# Patient Record
Sex: Female | Born: 1950
Health system: Southern US, Community
[De-identification: ages and names within clinical notes are randomized; demographics above are authoritative.]

## PROBLEM LIST (undated history)

## (undated) DIAGNOSIS — D693 Immune thrombocytopenic purpura: Secondary | ICD-10-CM

## (undated) DIAGNOSIS — K219 Gastro-esophageal reflux disease without esophagitis: Secondary | ICD-10-CM

## (undated) DIAGNOSIS — E785 Hyperlipidemia, unspecified: Secondary | ICD-10-CM

## (undated) DIAGNOSIS — T7840XA Allergy, unspecified, initial encounter: Secondary | ICD-10-CM

## (undated) DIAGNOSIS — D249 Benign neoplasm of unspecified breast: Secondary | ICD-10-CM

## (undated) HISTORY — DX: Immune thrombocytopenic purpura: D69.3

## (undated) HISTORY — DX: Hyperlipidemia, unspecified: E78.5

## (undated) HISTORY — DX: Allergy, unspecified, initial encounter: T78.40XA

## (undated) HISTORY — DX: Gastro-esophageal reflux disease without esophagitis: K21.9

## (undated) HISTORY — DX: Benign neoplasm of unspecified breast: D24.9

---

## 1994-11-17 DIAGNOSIS — D249 Benign neoplasm of unspecified breast: Secondary | ICD-10-CM

## 1994-11-17 HISTORY — DX: Benign neoplasm of unspecified breast: D24.9

## 1995-11-18 DIAGNOSIS — D693 Immune thrombocytopenic purpura: Secondary | ICD-10-CM

## 1995-11-18 HISTORY — DX: Immune thrombocytopenic purpura: D69.3

## 1997-11-17 LAB — HM DEXA SCAN: HM Dexa Scan: NORMAL

## 1998-11-02 ENCOUNTER — Other Ambulatory Visit: Admission: RE | Admit: 1998-11-02 | Discharge: 1998-11-02 | Payer: Self-pay | Admitting: Obstetrics and Gynecology

## 1999-11-20 ENCOUNTER — Other Ambulatory Visit: Admission: RE | Admit: 1999-11-20 | Discharge: 1999-11-20 | Payer: Self-pay | Admitting: Obstetrics and Gynecology

## 2000-12-17 ENCOUNTER — Other Ambulatory Visit: Admission: RE | Admit: 2000-12-17 | Discharge: 2000-12-17 | Payer: Self-pay | Admitting: Obstetrics and Gynecology

## 2001-04-17 ENCOUNTER — Encounter: Payer: Self-pay | Admitting: Internal Medicine

## 2001-04-17 LAB — CONVERTED CEMR LAB

## 2001-11-18 LAB — HM COLONOSCOPY

## 2003-07-14 ENCOUNTER — Other Ambulatory Visit: Admission: RE | Admit: 2003-07-14 | Discharge: 2003-07-14 | Payer: Self-pay | Admitting: Obstetrics and Gynecology

## 2004-08-30 ENCOUNTER — Other Ambulatory Visit: Admission: RE | Admit: 2004-08-30 | Discharge: 2004-08-30 | Payer: Self-pay | Admitting: Obstetrics and Gynecology

## 2006-01-01 ENCOUNTER — Ambulatory Visit: Payer: Self-pay | Admitting: Internal Medicine

## 2006-05-15 ENCOUNTER — Other Ambulatory Visit: Admission: RE | Admit: 2006-05-15 | Discharge: 2006-05-15 | Payer: Self-pay | Admitting: Obstetrics and Gynecology

## 2007-06-08 ENCOUNTER — Encounter: Payer: Self-pay | Admitting: Internal Medicine

## 2007-06-08 DIAGNOSIS — J301 Allergic rhinitis due to pollen: Secondary | ICD-10-CM | POA: Insufficient documentation

## 2007-06-08 DIAGNOSIS — K219 Gastro-esophageal reflux disease without esophagitis: Secondary | ICD-10-CM | POA: Insufficient documentation

## 2007-06-08 DIAGNOSIS — D693 Immune thrombocytopenic purpura: Secondary | ICD-10-CM | POA: Insufficient documentation

## 2007-06-08 DIAGNOSIS — E785 Hyperlipidemia, unspecified: Secondary | ICD-10-CM | POA: Insufficient documentation

## 2007-06-14 ENCOUNTER — Ambulatory Visit: Payer: Self-pay | Admitting: Internal Medicine

## 2007-06-14 DIAGNOSIS — E749 Disorder of carbohydrate metabolism, unspecified: Secondary | ICD-10-CM | POA: Insufficient documentation

## 2007-06-15 LAB — CONVERTED CEMR LAB
Basophils Absolute: 0 10*3/uL (ref 0.0–0.1)
Basophils Relative: 1 % (ref 0–1)
Eosinophils Absolute: 0.1 10*3/uL (ref 0.0–0.7)
Eosinophils Relative: 1 % (ref 0–5)
Glucose, Bld: 75 mg/dL (ref 70–99)
HCT: 41.8 % (ref 36.0–46.0)
Hemoglobin: 13.7 g/dL (ref 12.0–15.0)
Lymphocytes Relative: 26 % (ref 12–46)
Lymphs Abs: 1 10*3/uL (ref 0.7–3.3)
MCHC: 32.8 g/dL (ref 30.0–36.0)
MCV: 90.9 fL (ref 78.0–100.0)
Monocytes Absolute: 0.2 10*3/uL (ref 0.2–0.7)
Monocytes Relative: 6 % (ref 3–11)
Neutro Abs: 2.7 10*3/uL (ref 1.7–7.7)
Neutrophils Relative %: 66 % (ref 43–77)
Platelets: 132 10*3/uL — ABNORMAL LOW (ref 150–400)
RBC: 4.6 M/uL (ref 3.87–5.11)
RDW: 13.4 % (ref 11.5–14.0)
WBC: 4 10*3/uL (ref 4.0–10.5)

## 2007-11-16 ENCOUNTER — Encounter: Payer: Self-pay | Admitting: Internal Medicine

## 2007-11-24 ENCOUNTER — Encounter (INDEPENDENT_AMBULATORY_CARE_PROVIDER_SITE_OTHER): Payer: Self-pay | Admitting: *Deleted

## 2008-02-08 ENCOUNTER — Other Ambulatory Visit: Admission: RE | Admit: 2008-02-08 | Discharge: 2008-02-08 | Payer: Self-pay | Admitting: Obstetrics and Gynecology

## 2009-07-12 ENCOUNTER — Ambulatory Visit: Payer: Self-pay | Admitting: General Practice

## 2010-05-27 ENCOUNTER — Ambulatory Visit: Payer: Self-pay | Admitting: Obstetrics and Gynecology

## 2010-05-27 ENCOUNTER — Other Ambulatory Visit: Admission: RE | Admit: 2010-05-27 | Discharge: 2010-05-27 | Payer: Self-pay | Admitting: Obstetrics and Gynecology

## 2010-06-17 HISTORY — PX: WRIST FRACTURE SURGERY: SHX121

## 2010-07-04 ENCOUNTER — Ambulatory Visit: Payer: Self-pay | Admitting: Obstetrics and Gynecology

## 2010-07-13 ENCOUNTER — Emergency Department (HOSPITAL_COMMUNITY): Admission: EM | Admit: 2010-07-13 | Discharge: 2010-07-13 | Payer: Self-pay | Admitting: Family Medicine

## 2010-10-18 ENCOUNTER — Ambulatory Visit: Payer: Self-pay | Admitting: Internal Medicine

## 2010-10-21 LAB — CONVERTED CEMR LAB: Glucose, Bld: 98 mg/dL (ref 70–99)

## 2010-10-28 ENCOUNTER — Encounter: Payer: Self-pay | Admitting: Internal Medicine

## 2010-10-28 LAB — HM MAMMOGRAPHY

## 2010-10-29 ENCOUNTER — Encounter: Payer: Self-pay | Admitting: Internal Medicine

## 2010-12-19 NOTE — Letter (Signed)
Summary: Results Follow up Letter  [place] at [facility]  [address] [place], [place] [address]   Phone: [phone]  Fax: [phone]    11/24/2007 MRN: [id number]  [facility] [address] [place], [place]  [address]  Dear Ms. Grinage,  The following are the results of your recent test(s):  Test         Result    Pap Smear:        Normal _____  Not Normal _____ Comments: ______________________________________________________ Cholesterol: LDL(Bad cholesterol):         Your goal is less than:         HDL (Good cholesterol):       Your goal is more than: Comments:  ______________________________________________________ Mammogram:        Normal _X____  Not Normal _____ Comments:  ___________________________________________________________________ Hemoccult:        Normal _____  Not normal _______ Comments:    _____________________________________________________________________ Other Tests:    We routinely do not discuss normal results over the telephone.  If you desire a copy of the results, or you have any questions about this information we can discuss them at your next office visit.   Sincerely,  Dr. Alphonsus Sias

## 2010-12-19 NOTE — Miscellaneous (Signed)
  Clinical Lists Changes  Observations: Added new observation of MAMMOGRAM: normal (11/24/2007 7:54)       Preventive Care Screening  Mammogram:    Date:  11/24/2007    Results:  normal

## 2010-12-19 NOTE — Assessment & Plan Note (Signed)
Summary: CPX/CLE   Vital Signs:  Patient profile:   60 year old female Height:      66 inches Weight:      118.75 pounds BMI:     19.24 Temp:     97.7 degrees F oral Pulse rate:   64 / minute Pulse rhythm:   regular BP sitting:   120 / 90  (left arm) Cuff size:   regular  Vitals Entered By: Linde Gillis CMA Duncan Dull) (October 18, 2010 8:10 AM) CC: complete physicial, no pap   History of Present Illness: DOing well Has only had minor illnesses--generally goes to clinic through work  Still sees Dr Dianne Dun Keeps up with PAPs Due for mammogram--generally gets it through Dr Yolanda Bonine Due for colonoscopy in 2 years  Fractured left wrist in summer and needed surgery seems to have healed okay  Allergies (verified): No Known Drug Allergies  Past History:  Past medical, surgical, family and social histories (including risk factors) reviewed for relevance to current acute and chronic problems.  Past Medical History: Reviewed history from 06/08/2007 and no changes required. Allergic rhinitis GERD Hyperlipidemia ITP--1997  Past Surgical History: Bx  1968 Benign Right breast tumor  ~1996 DEXA- normal 1999 Right wrist fracture repaired 8/11  Family History: Reviewed history from 06/08/2007 and no changes required. Dad died @71    lung cancer, HTN Mom died @57   SLE, myocarditis Dennie Bible GF died of MI, had DM No breast cancer  Social History: Reviewed history from 06/08/2007 and no changes required. Marital Status: Married Children: None Occupation: Child psychotherapist- [employer] Never Smoked Alcohol use-rare  Review of Systems General:  weight stable sleeps well wears seat belt. Eyes:  Denies double vision and vision loss-1 eye. ENT:  Complains of decreased hearing; denies ringing in ears; mild hearing loss per husband Teeth okay-- regular with dentist. CV:  Denies chest pain or discomfort, difficulty breathing at night, difficulty breathing while lying  down, fainting, lightheadness, palpitations, and shortness of breath with exertion. Resp:  Denies cough and shortness of breath. GI:  Denies abdominal pain, bloody stools, change in bowel habits, dark tarry stools, indigestion, nausea, and vomiting. GU:  Denies dysuria and incontinence; No sexual problems. MS:  Denies joint pain and joint swelling. Derm:  Denies lesion(s) and rash. Neuro:  Denies headaches, numbness, tingling, and weakness. Psych:  Denies anxiety and depression. Heme:  Denies abnormal bruising and enlarge lymph nodes. Allergy:  Denies seasonal allergies and sneezing.  Physical Exam  General:  alert and normal appearance.   Eyes:  pupils equal, pupils round, pupils reactive to light, and no optic disk abnormalities.   Ears:  R ear normal and L ear normal.   Mouth:  no erythema, no exudates, and no lesions.   Neck:  supple, no masses, no thyromegaly, no carotid bruits, and no cervical lymphadenopathy.   Lungs:  normal respiratory effort, no intercostal retractions, no accessory muscle use, and normal breath sounds.   Heart:  normal rate, regular rhythm, no murmur, and no gallop.   Abdomen:  soft, non-tender, and no masses.   Msk:  no joint tenderness and no joint swelling.   Pulses:  1+ in feet Extremities:  no edema Neurologic:  alert & oriented X3, strength normal in all extremities, and gait normal.   Skin:  no rashes and no suspicious lesions.   Axillary Nodes:  No palpable lymphadenopathy Psych:  normally interactive, good eye contact, not anxious appearing, and not depressed appearing.  Impression & Recommendations:  Problem # 1:  PREVENTIVE HEALTH CARE (ICD-V70.0) Assessment Comment Only  healthy Tdap given will see regularly when done with gyn check glucose  Orders: Venipuncture (78295) TLB-Glucose, QUANT (82947-GLU)  Complete Medication List: 1)  Cod Liver Oil Caps (Cod liver oil) .... Take one tablet by mouth daily 2)  Folic Acid  3)  Calcium  + D  .... Take two by mouth once a day  Other Orders: Tdap => 49yrs IM (62130) Admin 1st Vaccine (86578)  Patient Instructions: 1)  Please schedule a follow-up appointment in 2-3 years for physical   Orders Added: 1)  Est. Patient 40-64 years [99396] 2)  Venipuncture [46962] 3)  TLB-Glucose, QUANT [82947-GLU] 4)  Tdap => 82yrs IM [90715] 5)  Admin 1st Vaccine [95284]   Immunizations Administered:  Tetanus Vaccine:    Vaccine Type: Tdap    Site: right deltoid    Mfr: GlaxoSmithKline    Dose: 0.5 ml    Route: IM    Given by: Linde Gillis CMA (AAMA)    Exp. Date: 09/05/2012    Lot #: [id number]    VIS given: 10/04/08 version given October 18, 2010.   Immunizations Administered:  Tetanus Vaccine:    Vaccine Type: Tdap    Site: right deltoid    Mfr: GlaxoSmithKline    Dose: 0.5 ml    Route: IM    Given by: Linde Gillis CMA (AAMA)    Exp. Date: 09/05/2012    Lot #: [id number]    VIS given: 10/04/08 version given October 18, 2010.  Current Allergies (reviewed today): No known allergies

## 2010-12-19 NOTE — Assessment & Plan Note (Signed)
Summary: CPX/RBH    History of Present Illness: see scanned note  Current Allergies: No known allergies                Appended Document: Orders Update    Clinical Lists Changes  Problems: Added new problem of DISORDER, CARBOHYDRATE METABOLISM NOS (ICD-271.9) Orders: Added new Service order of Venipuncture ([address]) - Signed Added new Test order of T-CBC w/Diff ([id number]) - Signed Added new Test order of T- * Misc. Laboratory test [phone]) - Signed

## 2010-12-19 NOTE — Letter (Signed)
Summary: Results Follow up Letter  [place] at [facility]  [address] [place], [place] [address]   Phone: [phone]  Fax: [phone]    10/29/2010 MRN: [id number]  [facility] [place] [address] [place], [place]  [address]  Dear Shelby Sullivan,  The following are the results of your recent test(s):  Test         Result    Pap Smear:        Normal _____  Not Normal _____ Comments: ______________________________________________________ Cholesterol: LDL(Bad cholesterol):         Your goal is less than:         HDL (Good cholesterol):       Your goal is more than: Comments:  ______________________________________________________ Mammogram:        Normal __X___  Not Normal _____ Comments:mammo looks fine, repeat recommended in 1-2 years  ___________________________________________________________________ Hemoccult:        Normal _____  Not normal _______ Comments:    _____________________________________________________________________ Other Tests:    We routinely do not discuss normal results over the telephone.  If you desire a copy of the results, or you have any questions about this information we can discuss them at your next office visit.   Sincerely,      Tillman Abide, MD

## 2012-01-09 ENCOUNTER — Encounter: Payer: Self-pay | Admitting: Internal Medicine

## 2012-08-13 ENCOUNTER — Encounter: Payer: Self-pay | Admitting: Internal Medicine

## 2012-08-13 ENCOUNTER — Ambulatory Visit (INDEPENDENT_AMBULATORY_CARE_PROVIDER_SITE_OTHER): Payer: PRIVATE HEALTH INSURANCE | Admitting: Internal Medicine

## 2012-08-13 VITALS — BP 108/70 | HR 72 | Temp 97.9°F | Ht 66.0 in | Wt 122.0 lb

## 2012-08-13 DIAGNOSIS — E785 Hyperlipidemia, unspecified: Secondary | ICD-10-CM

## 2012-08-13 DIAGNOSIS — Z1211 Encounter for screening for malignant neoplasm of colon: Secondary | ICD-10-CM

## 2012-08-13 DIAGNOSIS — Z0000 Encounter for general adult medical examination without abnormal findings: Secondary | ICD-10-CM

## 2012-08-13 LAB — LIPID PANEL
Cholesterol: 244 mg/dL — ABNORMAL HIGH (ref 0–200)
HDL: 73.1 mg/dL (ref >39.00)
Total CHOL/HDL Ratio: 3
Triglycerides: 84 mg/dL (ref 0.0–149.0)
VLDL: 16.8 mg/dL (ref 0.0–40.0)

## 2012-08-13 LAB — BASIC METABOLIC PANEL
BUN: 15 mg/dL (ref 6–23)
CO2: 27 mEq/L (ref 19–32)
Calcium: 9.4 mg/dL (ref 8.4–10.5)
Chloride: 104 mEq/L (ref 96–112)
Creatinine, Ser: 0.8 mg/dL (ref 0.4–1.2)
GFR: 80.34 mL/min (ref >60.00)
Glucose, Bld: 92 mg/dL (ref 70–99)
Potassium: 4.3 mEq/L (ref 3.5–5.1)
Sodium: 139 mEq/L (ref 135–145)

## 2012-08-13 LAB — CBC WITH DIFFERENTIAL/PLATELET
Basophils Absolute: 0 10*3/uL (ref 0.0–0.1)
Basophils Relative: 0.7 % (ref 0.0–3.0)
Eosinophils Absolute: 0 10*3/uL (ref 0.0–0.7)
Eosinophils Relative: 1.1 % (ref 0.0–5.0)
HCT: 42.9 % (ref 36.0–46.0)
Hemoglobin: 14.1 g/dL (ref 12.0–15.0)
Lymphocytes Relative: 23.3 % (ref 12.0–46.0)
Lymphs Abs: 1 10*3/uL (ref 0.7–4.0)
MCHC: 32.8 g/dL (ref 30.0–36.0)
MCV: 93 fl (ref 78.0–100.0)
Monocytes Absolute: 0.3 10*3/uL (ref 0.1–1.0)
Monocytes Relative: 6.4 % (ref 3.0–12.0)
Neutro Abs: 3 10*3/uL (ref 1.4–7.7)
Neutrophils Relative %: 68.5 % (ref 43.0–77.0)
Platelets: 108 10*3/uL — ABNORMAL LOW (ref 150.0–400.0)
RBC: 4.61 Mil/uL (ref 3.87–5.11)
RDW: 13 % (ref 11.5–14.6)
WBC: 4.3 10*3/uL — ABNORMAL LOW (ref 4.5–10.5)

## 2012-08-13 LAB — HEPATIC FUNCTION PANEL
ALT: 22 U/L (ref 0–35)
AST: 23 U/L (ref 0–37)
Albumin: 4.4 g/dL (ref 3.5–5.2)
Alkaline Phosphatase: 44 U/L (ref 39–117)
Bilirubin, Direct: 0.1 mg/dL (ref 0.0–0.3)
Total Bilirubin: 0.8 mg/dL (ref 0.3–1.2)
Total Protein: 7.1 g/dL (ref 6.0–8.3)

## 2012-08-13 LAB — TSH: TSH: 0.6 u[IU]/mL (ref 0.35–5.50)

## 2012-08-13 LAB — LDL CHOLESTEROL, DIRECT: Direct LDL: 150.3 mg/dL

## 2012-08-13 NOTE — Progress Notes (Signed)
Subjective:    Patient ID: Shelby Sullivan, female    DOB: 1951-03-29, 61 y.o.   MRN: [id number]  HPI Here for physical Same job Still sees Dr Dianne Dun mammo due in December  No acute medical concerns  No current outpatient prescriptions on file prior to visit.    No Known Allergies  Past Medical History  Diagnosis Date  . Allergy   . GERD (gastroesophageal reflux disease)   . Hyperlipidemia   . ITP (idiopathic thrombocytopenic purpura) 1997  . Benign tumor of breast 1996    Past Surgical History  Procedure Date  . Wrist fracture surgery 06/2010    right wrist    Family History  Problem Relation Age of Onset  . Hypertension Father   . Heart disease Paternal Grandfather   . Diabetes Paternal Grandfather   . Cancer Neg Hx     breast cancer    History   Social History  . Marital Status: Married    Spouse Name: N/A    Number of Children: N/A  . Years of Education: N/A   Occupational History  . Social worker    Social History Main Topics  . Smoking status: Never Smoker   . Smokeless tobacco: Never Used  . Alcohol Use: Yes  . Drug Use: No  . Sexually Active: Not on file   Other Topics Concern  . Not on file   Social History Narrative  . No narrative on file   Review of Systems  Constitutional: Negative for fatigue and unexpected weight change.       Wears seat belt Regular exercise  HENT: Positive for hearing loss, congestion and rhinorrhea. Negative for dental problem and tinnitus.        R>L hearing loss Uses OTC allergy meds prn Regular with dentist--lots of crowns  Eyes: Negative for visual disturbance.       No diplopia or unilateral vision loss Switched contact lenses  Respiratory: Negative for cough, chest tightness and shortness of breath.   Cardiovascular: Positive for palpitations. Negative for chest pain and leg swelling.       Palps with decongestant  Gastrointestinal: Negative for nausea, vomiting, abdominal pain, constipation and  blood in stool.       Heartburn is better  Genitourinary: Negative for difficulty urinating and dyspareunia.  Musculoskeletal: Positive for arthralgias. Negative for back pain and joint swelling.       Has some CMC pain---uses advil before tennis  Skin: Negative for rash.       Saw derm yesterday---lesion removed from left ear  Neurological: Positive for numbness. Negative for dizziness, syncope, weakness, light-headedness and headaches.       Occ left hand numbness---adjustment of neck by chiropractor helps temporarily  Hematological: Negative for adenopathy. Bruises/bleeds easily.  Psychiatric/Behavioral: Negative for disturbed wake/sleep cycle and dysphoric mood. The patient is not nervous/anxious.        Objective:   Physical Exam  Constitutional: She is oriented to person, place, and time. She appears well-developed and well-nourished. No distress.  HENT:  Head: Normocephalic and atraumatic.  Right Ear: External ear normal.  Left Ear: External ear normal.  Mouth/Throat: Oropharynx is clear and moist. No oropharyngeal exudate.  Eyes: Conjunctivae normal and EOM are normal. Pupils are equal, round, and reactive to light.  Neck: Normal Enck of motion. Neck supple. No thyromegaly present.  Cardiovascular: Normal rate, regular rhythm, normal heart sounds and intact distal pulses.  Exam reveals no gallop.   No murmur heard. Pulmonary/Chest: Effort  normal and breath sounds normal. No respiratory distress. She has no wheezes. She has no rales.  Abdominal: Soft. There is no tenderness.  Musculoskeletal: Normal Dempsey of motion. She exhibits no edema and no tenderness.  Lymphadenopathy:    She has no cervical adenopathy.    She has no axillary adenopathy.  Neurological: She is alert and oriented to person, place, and time.  Skin: No rash noted. No erythema.  Psychiatric: She has a normal mood and affect. Her behavior is normal. Thought content normal.          Assessment & Plan:

## 2012-08-13 NOTE — Assessment & Plan Note (Signed)
No Rx for primary prevention Will check labs though

## 2012-08-13 NOTE — Assessment & Plan Note (Signed)
Healthy Exercises regularly Due for colonoscopy Will check labs

## 2012-08-17 ENCOUNTER — Encounter: Payer: Self-pay | Admitting: *Deleted

## 2012-10-05 ENCOUNTER — Ambulatory Visit (AMBULATORY_SURGERY_CENTER): Payer: PRIVATE HEALTH INSURANCE | Admitting: *Deleted

## 2012-10-05 VITALS — Ht 66.5 in | Wt 123.2 lb

## 2012-10-05 DIAGNOSIS — Z1211 Encounter for screening for malignant neoplasm of colon: Secondary | ICD-10-CM

## 2012-10-05 MED ORDER — MOVIPREP 100 G PO SOLR: ORAL | Status: DC

## 2012-10-12 ENCOUNTER — Ambulatory Visit (AMBULATORY_SURGERY_CENTER): Payer: PRIVATE HEALTH INSURANCE | Admitting: Internal Medicine

## 2012-10-12 ENCOUNTER — Encounter: Payer: Self-pay | Admitting: Internal Medicine

## 2012-10-12 VITALS — BP 115/75 | HR 70 | Temp 98.2°F | Resp 12 | Ht 66.5 in | Wt 123.0 lb

## 2012-10-12 DIAGNOSIS — Z1211 Encounter for screening for malignant neoplasm of colon: Secondary | ICD-10-CM

## 2012-10-12 MED ORDER — SODIUM CHLORIDE 0.9 % IV SOLN: 500.0000 mL | INTRAVENOUS | Status: DC

## 2012-10-12 NOTE — Op Note (Signed)
[facility] [address] [place], [address]   COLONOSCOPY PROCEDURE REPORT  PATIENT: Shelby Sullivan, Shelby Sullivan  MR#: [id number] BIRTHDATE: Oct 09, 1951 , 61  yrs. old GENDER: Female ENDOSCOPIST: Hart Carwin, MD REFERRED BY:  Tillman Abide, M.D. PROCEDURE DATE:  10/12/2012 PROCEDURE:   Colonoscopy, screening ASA CLASS:   Class I INDICATIONS:Average risk patient for colon cancer and last colonoscopy 2003 was normal except for hemorrhoids and scattered diverticuli. MEDICATIONS: MAC sedation, administered by CRNA and propofol (Diprivan) 200mg  IV  DESCRIPTION OF PROCEDURE:   After the risks and benefits and of the procedure were explained, informed consent was obtained.  A digital rectal exam revealed no abnormalities of the rectum.    The LB [id number]  endoscope was introduced through the anus and advanced to the cecum, which was identified by both the appendix and ileocecal valve .  The quality of the prep was good, using MoviPrep .  The instrument was then slowly withdrawn as the colon was fully examined.     COLON FINDINGS: A normal appearing cecum, ileocecal valve, and appendiceal orifice were identified.  The ascending, hepatic flexure, transverse, splenic flexure, descending, sigmoid colon and rectum appeared unremarkable.  No polyps or cancers were seen. Retroflexed views revealed no abnormalities.     The scope was then withdrawn from the patient and the procedure completed.  COMPLICATIONS: There were no complications.  ENDOSCOPIC IMPRESSION:  Normal colon  RECOMMENDATIONS: High fiber diet   REPEAT EXAM: In 10 year(s)  for Colonoscopy.  cc:  _______________________________ eSignedHart Carwin, MD 10/12/2012 9:33 AM     PATIENT NAME:  [name], [name] MR#: [id number]

## 2012-10-12 NOTE — Patient Instructions (Addendum)
Normal colonoscopy.    Repeat colonoscopy in 10 years.  YOU HAD AN ENDOSCOPIC PROCEDURE TODAY AT THE [facility]: Refer to the procedure report that was given to you for any specific questions about what was found during the examination.  If the procedure report does not answer your questions, please call your gastroenterologist to clarify.  If you requested that your care partner not be given the details of your procedure findings, then the procedure report has been included in a sealed envelope for you to review at your convenience later.  YOU SHOULD EXPECT: Some feelings of bloating in the abdomen. Passage of more gas than usual.  Walking can help get rid of the air that was put into your GI tract during the procedure and reduce the bloating. If you had a lower endoscopy (such as a colonoscopy or flexible sigmoidoscopy) you may notice spotting of blood in your stool or on the toilet paper. If you underwent a bowel prep for your procedure, then you may not have a normal bowel movement for a few days.  DIET: Your first meal following the procedure should be a light meal and then it is ok to progress to your normal diet.  A half-sandwich or bowl of soup is an example of a good first meal.  Heavy or fried foods are harder to digest and may make you feel nauseous or bloated.  Likewise meals heavy in dairy and vegetables can cause extra gas to form and this can also increase the bloating.  Drink plenty of fluids but you should avoid alcoholic beverages for 24 hours.  ACTIVITY: Your care partner should take you home directly after the procedure.  You should plan to take it easy, moving slowly for the rest of the day.  You can resume normal activity the day after the procedure however you should NOT DRIVE or use heavy machinery for 24 hours (because of the sedation medicines used during the test).    SYMPTOMS TO REPORT IMMEDIATELY: A gastroenterologist can be reached at any hour.  During normal  business hours, 8:30 AM to 5:00 PM Monday through Friday, call [phone].  After hours and on weekends, please call the GI answering service at [phone] who will take a message and have the physician on call contact you.   Following lower endoscopy (colonoscopy or flexible sigmoidoscopy):  Excessive amounts of blood in the stool  Significant tenderness or worsening of abdominal pains  Swelling of the abdomen that is new, acute  Fever of 100F or higher   FOLLOW UP: If any biopsies were taken you will be contacted by phone or by letter within the next 1-3 weeks.  Call your gastroenterologist if you have not heard about the biopsies in 3 weeks.  Our staff will call the home number listed on your records the next business day following your procedure to check on you and address any questions or concerns that you may have at that time regarding the information given to you following your procedure. This is a courtesy call and so if there is no answer at the home number and we have not heard from you through the emergency physician on call, we will assume that you have returned to your regular daily activities without incident.  SIGNATURES/CONFIDENTIALITY: You and/or your care partner have signed paperwork which will be entered into your electronic medical record.  These signatures attest to the fact that that the information above on your After Visit Summary has been   reviewed and is understood.  Full responsibility of the confidentiality of this discharge information lies with you and/or your care-partner. 

## 2012-10-12 NOTE — Progress Notes (Signed)
Patient did not experience any of the following events: a burn prior to discharge; a fall within the facility; wrong site/side/patient/procedure/implant event; or a hospital transfer or hospital admission upon discharge from the facility. (G8907) Patient did not have preoperative order for IV antibiotic SSI prophylaxis. (G8918)  

## 2012-10-13 ENCOUNTER — Telehealth: Payer: Self-pay | Admitting: *Deleted

## 2012-10-13 NOTE — Telephone Encounter (Signed)
No answer left message to call office if questions or concerns. 

## 2012-10-19 ENCOUNTER — Encounter: Payer: PRIVATE HEALTH INSURANCE | Admitting: Internal Medicine

## 2013-04-29 ENCOUNTER — Encounter: Payer: Self-pay | Admitting: Internal Medicine

## 2013-04-29 ENCOUNTER — Encounter: Payer: Self-pay | Admitting: *Deleted

## 2015-05-29 ENCOUNTER — Encounter: Payer: Self-pay | Admitting: Primary Care

## 2015-05-29 ENCOUNTER — Ambulatory Visit (INDEPENDENT_AMBULATORY_CARE_PROVIDER_SITE_OTHER): Payer: PRIVATE HEALTH INSURANCE | Admitting: Primary Care

## 2015-05-29 VITALS — BP 110/68 | HR 63 | Temp 97.5°F | Ht 66.0 in | Wt 124.4 lb

## 2015-05-29 DIAGNOSIS — R21 Rash and other nonspecific skin eruption: Secondary | ICD-10-CM | POA: Diagnosis not present

## 2015-05-29 NOTE — Patient Instructions (Signed)
Start taking a daily antihistamine such as Claritin, Zyrtec, Allegra.  Continue taking prednisone.  Give [place] a call if your symptoms become worse.   It was nice to meet you!

## 2015-05-29 NOTE — Progress Notes (Signed)
   Subjective:    Patient ID: Shelby Sullivan, female    DOB: 29-May-1951, 64 y.o.   MRN: [id number]  HPI  Ms. Shelby Sullivan is a 64 year old female who presents today with a chief complaint of rash. The rash has been present intermittently since the beginning of June this year.  She was initially evaluated and treated for shingles with acyclovir June 11th. Sunday morning she woke up with a rash to her groin area. She then went exercising and noticed welps to her anterior trunk and extremities several hours later. She was evaluated at her work clinic yesterday and provided with a prescription for prednisone with a suspicion that this was an allergic reaction. Her rash is painful (welps), itchy. Since yesterday her rash as improved, although she would like a second opinion. Denies difficulty breathing, throat closing, itchy/scratchy throat. She did change laundry detergents 2 weeks ago. She's been taking the prednisone as prescribed and 1 benadryl last night.  Review of Systems  Constitutional: Positive for chills. Negative for fever.  Musculoskeletal: Negative for myalgias.  Skin: Positive for rash.  Neurological: Negative for headaches.       Past Medical History  Diagnosis Date  . Allergy   . GERD (gastroesophageal reflux disease)   . Hyperlipidemia   . ITP (idiopathic thrombocytopenic purpura) 1997  . Benign tumor of breast 1996    History   Social History  . Marital Status: Married    Spouse Name: N/A  . Number of Children: N/A  . Years of Education: N/A   Occupational History  . Social worker    Social History Main Topics  . Smoking status: Never Smoker   . Smokeless tobacco: Never Used  . Alcohol Use: 4.2 oz/week    7 Glasses of wine per week  . Drug Use: No  . Sexual Activity: Not on file   Other Topics Concern  . Not on file   Social History Narrative    Past Surgical History  Procedure Laterality Date  . Wrist fracture surgery  06/2010    right wrist    Family  History  Problem Relation Age of Onset  . Hypertension Father   . Heart disease Paternal Grandfather   . Diabetes Paternal Grandfather   . Cancer Neg Hx     breast cancer  . Colon cancer Neg Hx   . Esophageal cancer Neg Hx   . Rectal cancer Neg Hx   . Stomach cancer Neg Hx     No Known Allergies  No current outpatient prescriptions on file prior to visit.   No current facility-administered medications on file prior to visit.    BP 110/68 mmHg  Pulse 63  Temp(Src) 97.5 F (36.4 C) (Oral)  Ht 5\' 6"  (1.676 m)  Wt 124 lb 6.4 oz (56.427 kg)  BMI 20.09 kg/m2  SpO2 93%     Objective:   Physical Exam  Cardiovascular: Normal rate and regular rhythm.   Pulmonary/Chest: Effort normal and breath sounds normal.  Skin: Skin is warm and dry. Rash noted.  Fading rash that represents wheals to bilateral upper extremities. No rash to anterior or posterior trunk. Skin intact. Non tender.          Assessment & Plan:  Rash:  Appears to be allergic reaction. Stable in office. Fading wheals to bilateral upper and lower extremities. Continue prednisone. Add daily second generation antihistamine. Follow up if rash worsens or if development of throat closure, itchy/scratchy throat.

## 2015-05-29 NOTE — Progress Notes (Signed)
Pre visit review using our clinic review tool, if applicable. No additional management support is needed unless otherwise documented below in the visit note. 

## 2015-09-07 LAB — HM MAMMOGRAPHY: HM Mammogram: NORMAL

## 2015-09-13 ENCOUNTER — Encounter: Payer: Self-pay | Admitting: *Deleted

## 2015-09-13 ENCOUNTER — Encounter: Payer: Self-pay | Admitting: Internal Medicine

## 2015-11-09 ENCOUNTER — Ambulatory Visit: Payer: Self-pay | Admitting: Physician Assistant

## 2015-11-09 ENCOUNTER — Encounter: Payer: Self-pay | Admitting: Physician Assistant

## 2015-11-09 VITALS — BP 110/70 | HR 72 | Temp 97.9°F

## 2015-11-09 DIAGNOSIS — L03012 Cellulitis of left finger: Secondary | ICD-10-CM

## 2015-11-09 MED ORDER — SULFAMETHOXAZOLE-TRIMETHOPRIM 800-160 MG PO TABS: 1.0000 | ORAL_TABLET | Freq: Two times a day (BID) | ORAL | Status: DC

## 2015-11-09 NOTE — Progress Notes (Signed)
S: c/o left 2nd finer pain, jammed it few days ago, now area at cuticle is red and swollen, entire finger is a little swollen, no fever/chills or drainage  O: vitals wnl, nad, left 2nd finer with swollen cuticle area, distal finger tender and swollen on volar surface, no pustules or drainage, area feels warm to touch, n/v intact  A: paronychia, finger tip infection  P: septra ds 1 po bid x 7d, warm water soaks with epson salts

## 2015-11-15 ENCOUNTER — Encounter: Payer: Self-pay | Admitting: Internal Medicine

## 2015-11-15 ENCOUNTER — Telehealth: Payer: Self-pay | Admitting: Internal Medicine

## 2015-11-15 ENCOUNTER — Ambulatory Visit (INDEPENDENT_AMBULATORY_CARE_PROVIDER_SITE_OTHER): Payer: PRIVATE HEALTH INSURANCE | Admitting: Internal Medicine

## 2015-11-15 VITALS — BP 108/60 | HR 64 | Temp 97.8°F | Wt 124.0 lb

## 2015-11-15 DIAGNOSIS — R1013 Epigastric pain: Secondary | ICD-10-CM | POA: Diagnosis not present

## 2015-11-15 NOTE — Patient Instructions (Signed)
Please try over the counter omeprazole (prilosec) 20mg  daily on an empty stomach for 2 weeks to see if that helps.

## 2015-11-15 NOTE — Assessment & Plan Note (Signed)
More like pulling sensation that is brief but recurrent Not related to meals No other GI symptoms other than mild heartburn that seems separate Could be ulcer (will check H pylori just in case)--- slight NSAID use Doubt gallbladder or pancreas Not esophageal Will check general BW though Trial of omeprazole for 2 weeks Consider further eval if persists

## 2015-11-15 NOTE — Addendum Note (Signed)
Addended by: Ellamae Sia on: 11/15/2015 03:53 PM   Modules accepted: Orders

## 2015-11-15 NOTE — Progress Notes (Signed)
Pre visit review using our clinic review tool, if applicable. No additional management support is needed unless otherwise documented below in the visit note. 

## 2015-11-15 NOTE — Telephone Encounter (Signed)
Pt did not come in for their appt today for an acute visit. Please let me know if pt needs to be contacted immediately for follow up or no follow up needed. Best phone number to contact pt is [phone].

## 2015-11-15 NOTE — Telephone Encounter (Signed)
ERROR, DISREGARD

## 2015-11-15 NOTE — Progress Notes (Signed)
Subjective:    Patient ID: Shelby Sullivan, female    DOB: Sep 14, 1951, 64 y.o.   MRN: [id number]  HPI Here due to abdominal pain  Has recently noted a tugging sensation--like an ache Under sternum First noticed about 3 weeks ago A few times a day--more when lying down at first, now that is not the case Lasts for seconds Wonders if it is reflux-- does feel it in mid or upper chest substernal  Appetite is fine No N/V Bowels are fine--no blood or change Stopped protonix years ago-- has done well until now  Stress at work, remodeling home Lots of caffeine--no change  No chest pain Did notice some epigastric tenderness if bending over  No dysphagia  Current Outpatient Prescriptions on File Prior to Visit  Medication Sig Dispense Refill  . sulfamethoxazole-trimethoprim (BACTRIM DS,SEPTRA DS) 800-160 MG tablet Take 1 tablet by mouth 2 (two) times daily. 14 tablet 0   No current facility-administered medications on file prior to visit.    No Known Allergies  Past Medical History  Diagnosis Date  . Allergy   . GERD (gastroesophageal reflux disease)   . Hyperlipidemia   . ITP (idiopathic thrombocytopenic purpura) 1997  . Benign tumor of breast 1996    Past Surgical History  Procedure Laterality Date  . Wrist fracture surgery  06/2010    right wrist    Family History  Problem Relation Age of Onset  . Hypertension Father   . Heart disease Paternal Grandfather   . Diabetes Paternal Grandfather   . Cancer Neg Hx     breast cancer  . Colon cancer Neg Hx   . Esophageal cancer Neg Hx   . Rectal cancer Neg Hx   . Stomach cancer Neg Hx     Social History   Social History  . Marital Status: Married    Spouse Name: N/A  . Number of Children: N/A  . Years of Education: N/A   Occupational History  . Social worker    Social History Main Topics  . Smoking status: Never Smoker   . Smokeless tobacco: Never Used  . Alcohol Use: 4.2 oz/week    7 Glasses of wine per  week  . Drug Use: No  . Sexual Activity: Not on file   Other Topics Concern  . Not on file   Social History Narrative   Review of Systems Dr Ashley Murrain retired--- hasn't established with someone else Still working --Education officer, museum for [employer] On bactrim since 6 days ago--infectioin in left 2nd nail (hasn't changed symptoms or felt better at first) Takes 4-6 ibuprofen a week     Objective:   Physical Exam  Constitutional: She appears well-developed and well-nourished. No distress.  HENT:  Mouth/Throat: Oropharynx is clear and moist. No oropharyngeal exudate.  Neck: Normal Fennema of motion. Neck supple. No thyromegaly present.  Cardiovascular: Normal rate, regular rhythm and normal heart sounds.  Exam reveals no gallop.   No murmur heard. Pulmonary/Chest: Effort normal and breath sounds normal. No respiratory distress. She has no wheezes. She has no rales.  Abdominal: Soft. Bowel sounds are normal. She exhibits no distension. There is no tenderness. There is no rebound and no guarding.  E(igastrium is point of her symptoms but not tender  Musculoskeletal: She exhibits no edema.  Lymphadenopathy:    She has no cervical adenopathy.  Psychiatric: She has a normal mood and affect. Her behavior is normal.          Assessment &  Plan:

## 2015-11-16 ENCOUNTER — Other Ambulatory Visit: Payer: Self-pay

## 2015-11-16 DIAGNOSIS — IMO0001 Reserved for inherently not codable concepts without codable children: Secondary | ICD-10-CM

## 2015-11-16 LAB — COMPREHENSIVE METABOLIC PANEL
ALT: 17 U/L (ref 0–35)
AST: 24 U/L (ref 0–37)
Albumin: 4.2 g/dL (ref 3.5–5.2)
Alkaline Phosphatase: 42 U/L (ref 39–117)
BUN: 11 mg/dL (ref 6–23)
CO2: 28 mEq/L (ref 19–32)
Calcium: 9.3 mg/dL (ref 8.4–10.5)
Chloride: 101 mEq/L (ref 96–112)
Creatinine, Ser: 0.92 mg/dL (ref 0.40–1.20)
GFR: 64.76 mL/min (ref >60.00)
Glucose, Bld: 91 mg/dL (ref 70–99)
Potassium: 4.2 mEq/L (ref 3.5–5.1)
Sodium: 135 mEq/L (ref 135–145)
Total Bilirubin: 0.3 mg/dL (ref 0.2–1.2)
Total Protein: 6.5 g/dL (ref 6.0–8.3)

## 2015-11-16 LAB — CBC WITH DIFFERENTIAL/PLATELET
Basophils Absolute: 0 10*3/uL (ref 0.0–0.1)
Basophils Relative: 0.5 % (ref 0.0–3.0)
Eosinophils Absolute: 0.1 10*3/uL (ref 0.0–0.7)
Eosinophils Relative: 4.4 % (ref 0.0–5.0)
HCT: 41 % (ref 36.0–46.0)
Hemoglobin: 13.7 g/dL (ref 12.0–15.0)
Lymphocytes Relative: 17.2 % (ref 12.0–46.0)
Lymphs Abs: 0.4 10*3/uL — ABNORMAL LOW (ref 0.7–4.0)
MCHC: 33.4 g/dL (ref 30.0–36.0)
MCV: 88.9 fl (ref 78.0–100.0)
Monocytes Absolute: 0.3 10*3/uL (ref 0.1–1.0)
Monocytes Relative: 12.5 % — ABNORMAL HIGH (ref 3.0–12.0)
Neutro Abs: 1.5 10*3/uL (ref 1.4–7.7)
Neutrophils Relative %: 65.4 % (ref 43.0–77.0)
Platelets: 88 10*3/uL — ABNORMAL LOW (ref 150.0–400.0)
RBC: 4.61 Mil/uL (ref 3.87–5.11)
RDW: 13 % (ref 11.5–15.5)
WBC: 2.3 10*3/uL — ABNORMAL LOW (ref 4.0–10.5)

## 2015-11-16 LAB — LIPASE: Lipase: 15 U/L (ref 7–60)

## 2015-11-16 LAB — H. PYLORI ANTIBODY, IGG: H Pylori IgG: NEGATIVE

## 2015-11-17 ENCOUNTER — Other Ambulatory Visit: Payer: Self-pay | Admitting: Internal Medicine

## 2015-11-17 DIAGNOSIS — D696 Thrombocytopenia, unspecified: Secondary | ICD-10-CM

## 2015-11-19 ENCOUNTER — Encounter: Payer: Self-pay | Admitting: Internal Medicine

## 2015-11-22 ENCOUNTER — Encounter: Payer: Self-pay | Admitting: *Deleted

## 2015-12-03 ENCOUNTER — Other Ambulatory Visit (INDEPENDENT_AMBULATORY_CARE_PROVIDER_SITE_OTHER): Payer: Managed Care, Other (non HMO)

## 2015-12-03 DIAGNOSIS — D696 Thrombocytopenia, unspecified: Secondary | ICD-10-CM

## 2015-12-03 LAB — CBC WITH DIFFERENTIAL/PLATELET
Basophils Absolute: 0 10*3/uL (ref 0.0–0.1)
Basophils Relative: 0.7 % (ref 0.0–3.0)
Eosinophils Absolute: 0 10*3/uL (ref 0.0–0.7)
Eosinophils Relative: 0.2 % (ref 0.0–5.0)
HCT: 39.5 % (ref 36.0–46.0)
Hemoglobin: 13 g/dL (ref 12.0–15.0)
Lymphocytes Relative: 49.9 % — ABNORMAL HIGH (ref 12.0–46.0)
Lymphs Abs: 1.5 10*3/uL (ref 0.7–4.0)
MCHC: 33 g/dL (ref 30.0–36.0)
MCV: 89.5 fl (ref 78.0–100.0)
Monocytes Absolute: 0.3 10*3/uL (ref 0.1–1.0)
Monocytes Relative: 10.2 % (ref 3.0–12.0)
Neutro Abs: 1.2 10*3/uL — ABNORMAL LOW (ref 1.4–7.7)
Neutrophils Relative %: 39 % — ABNORMAL LOW (ref 43.0–77.0)
Platelets: 114 10*3/uL — ABNORMAL LOW (ref 150.0–400.0)
RBC: 4.41 Mil/uL (ref 3.87–5.11)
RDW: 13.3 % (ref 11.5–15.5)
WBC: 3 10*3/uL — ABNORMAL LOW (ref 4.0–10.5)

## 2015-12-05 ENCOUNTER — Telehealth: Payer: Self-pay | Admitting: Internal Medicine

## 2015-12-05 NOTE — Telephone Encounter (Signed)
Patient returned Dee's call. °

## 2015-12-05 NOTE — Telephone Encounter (Signed)
Pt returned Dee's call. I informed pt of Dr. Everardo Beals comments about blood work but I was unable to answer her concerns. She is going to try Mychart again (she lost username and password) but would still like call returned.

## 2015-12-10 NOTE — Telephone Encounter (Signed)
See result note.  

## 2016-02-12 ENCOUNTER — Ambulatory Visit: Payer: Self-pay | Admitting: Physician Assistant

## 2016-02-12 ENCOUNTER — Encounter: Payer: Self-pay | Admitting: Physician Assistant

## 2016-02-12 VITALS — BP 109/60 | HR 70 | Temp 98.5°F

## 2016-02-12 DIAGNOSIS — R509 Fever, unspecified: Secondary | ICD-10-CM

## 2016-02-12 DIAGNOSIS — Z20828 Contact with and (suspected) exposure to other viral communicable diseases: Secondary | ICD-10-CM

## 2016-02-12 LAB — POCT INFLUENZA A/B
Influenza A, POC: NEGATIVE
Influenza B, POC: NEGATIVE

## 2016-02-12 MED ORDER — OSELTAMIVIR PHOSPHATE 75 MG PO CAPS: 75.0000 mg | ORAL_CAPSULE | Freq: Every day | ORAL | Status: DC

## 2016-02-12 NOTE — Progress Notes (Signed)
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

## 2016-11-03 ENCOUNTER — Ambulatory Visit: Payer: Self-pay | Admitting: Physician Assistant

## 2016-11-03 ENCOUNTER — Encounter: Payer: Self-pay | Admitting: Physician Assistant

## 2016-11-03 VITALS — BP 137/76 | HR 64 | Temp 97.7°F

## 2016-11-03 DIAGNOSIS — H6981 Other specified disorders of Eustachian tube, right ear: Secondary | ICD-10-CM

## 2016-11-03 MED ORDER — FLUTICASONE PROPIONATE 50 MCG/ACT NA SUSP: 2.0000 | Freq: Every day | NASAL | 6 refills | Status: DC

## 2016-11-03 NOTE — Progress Notes (Signed)
S:  C/o ears popping and being stopped up, no drainage from ears, no fever/chills, no cough or congestion, some sinus pressure, did have the uri virus after thanksgiving but got better except for her ear; remainder ros neg Using otc meds without relief  O:  Vitals wnl, nad, tms dull b/l, nasal mucosa swollen, throat wnl, neck supple no lymph, lungs c t a, cv rrr, neuro intact  A: acute eustachean tube dysfunction  P: flonase,  return if not improving in 3 to 5 days, return earlier if worsening

## 2016-11-27 DIAGNOSIS — H35363 Drusen (degenerative) of macula, bilateral: Secondary | ICD-10-CM | POA: Diagnosis not present

## 2017-01-15 ENCOUNTER — Ambulatory Visit (INDEPENDENT_AMBULATORY_CARE_PROVIDER_SITE_OTHER): Payer: Medicare HMO | Admitting: Internal Medicine

## 2017-01-15 ENCOUNTER — Encounter: Payer: Self-pay | Admitting: Internal Medicine

## 2017-01-15 VITALS — BP 114/62 | HR 67 | Temp 98.0°F | Ht 65.5 in | Wt 124.8 lb

## 2017-01-15 DIAGNOSIS — Z0000 Encounter for general adult medical examination without abnormal findings: Secondary | ICD-10-CM | POA: Diagnosis not present

## 2017-01-15 DIAGNOSIS — J301 Allergic rhinitis due to pollen: Secondary | ICD-10-CM | POA: Diagnosis not present

## 2017-01-15 DIAGNOSIS — E785 Hyperlipidemia, unspecified: Secondary | ICD-10-CM | POA: Diagnosis not present

## 2017-01-15 DIAGNOSIS — Z7189 Other specified counseling: Secondary | ICD-10-CM | POA: Diagnosis not present

## 2017-01-15 DIAGNOSIS — Z23 Encounter for immunization: Secondary | ICD-10-CM | POA: Diagnosis not present

## 2017-01-15 DIAGNOSIS — D696 Thrombocytopenia, unspecified: Secondary | ICD-10-CM

## 2017-01-15 NOTE — Assessment & Plan Note (Signed)
Low risk Will recheck No meds for primary prevention after discussion

## 2017-01-15 NOTE — Assessment & Plan Note (Signed)
See social history Blank forms given 

## 2017-01-15 NOTE — Addendum Note (Signed)
Addended by: Modena Nunnery on: 01/15/2017 04:05 PM   Modules accepted: Orders

## 2017-01-15 NOTE — Assessment & Plan Note (Addendum)
I have personally reviewed the Medicare Annual Wellness questionnaire and have noted 1. The patient's medical and social history 2. Their use of alcohol, tobacco or illicit drugs 3. Their current medications and supplements 4. The patient's functional ability including ADL's, fall risks, home safety risks and hearing or visual             impairment. 5. Diet and physical activities 6. Evidence for depression or mood disorders  The patients weight, height, BMI and visual acuity have been recorded in the chart I have made referrals, counseling and provided education to the patient based review of the above and I have provided the pt with a written personalized care plan for preventive services.  I have provided you with a copy of your personalized plan for preventive services. Please take the time to review along with your updated medication list.  prevnar today Initial EKG done--- this is normal Has had DEXA Colon due 2023 mammo due 10/18 Exercises regularly

## 2017-01-15 NOTE — Addendum Note (Signed)
Addended by: Marchia Bond on: 01/15/2017 04:23 PM   Modules accepted: Orders

## 2017-01-15 NOTE — Addendum Note (Signed)
Addended by: Modena Nunnery on: 01/15/2017 04:08 PM   Modules accepted: Orders

## 2017-01-15 NOTE — Progress Notes (Signed)
Subjective:    Patient ID: Shelby Sullivan, female    DOB: 05-Jun-1951, 66 y.o.   MRN: [id number]  HPI Here for first Medicare preventative exam-- and follow up Reviewed form and advanced directives Reviewed other doctors She did retire last year and recently went on Medicare Vision is fine Some mild hearing problems Enjoys regular wine No tobacco Exercises regular No falls No depression or anhedonia. So far, happy with retirement. Some volunteer work Independent with instrumental ADLs No memory problems  No gyn Dr Ashley Murrain retired  Ongoing allergy symptoms Ears get stopped up--did finally improve On flonase only  No recent problems with GERD No meds for this  Has high cholesterol Not interested in treatment  Current Outpatient Prescriptions on File Prior to Visit  Medication Sig Dispense Refill  . fluticasone (FLONASE) 50 MCG/ACT nasal spray Place 2 sprays into both nostrils daily. 16 g 6   No current facility-administered medications on file prior to visit.     No Known Allergies  Past Medical History:  Diagnosis Date  . Allergy   . Benign tumor of breast 1996  . GERD (gastroesophageal reflux disease)   . Hyperlipidemia   . ITP (idiopathic thrombocytopenic purpura) 1997    Past Surgical History:  Procedure Laterality Date  . WRIST FRACTURE SURGERY  06/2010   right wrist    Family History  Problem Relation Age of Onset  . Hypertension Father   . Heart disease Paternal Grandfather   . Diabetes Paternal Grandfather   . Cancer Neg Hx     breast cancer  . Colon cancer Neg Hx   . Esophageal cancer Neg Hx   . Rectal cancer Neg Hx   . Stomach cancer Neg Hx     Social History   Social History  . Marital status: Married    Spouse name: N/A  . Number of children: 0  . Years of education: N/A   Occupational History  . 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 Smoking status: Never Smoker  . Smokeless tobacco: Never  Used  . Alcohol use 4.2 oz/week    7 Glasses of wine per week  . Drug use: No  . Sexual activity: Not on file   Other Topics Concern  . Not on file   Social History Narrative   No living will   Would want husband as POA   Would accept resuscitation attempts   Would not want prolonged tube feeds if cognitively unaware   Review of Systems Appetite is good Weight up sllightly Sleeps well Wears seat belt Some problems with mouth alignment-- teeth okay ([facility]) No back or joint pains Bowels are good. No blood No urinary problems No rash or suspicious lesions No chest pain, SOB,  Dizziness Will get palpitations to certain scents    Objective:   Physical Exam  Constitutional: She is oriented to person, place, and time. She appears well-nourished. No distress.  HENT:  Mouth/Throat: Oropharynx is clear and moist. No oropharyngeal exudate.  Neck: No thyromegaly present.  Cardiovascular: Normal rate, regular rhythm, normal heart sounds and intact distal pulses.  Exam reveals no gallop.   No murmur heard. Pulmonary/Chest: Effort normal and breath sounds normal. No respiratory distress. She has no wheezes. She has no rales.  Abdominal: Soft. There is no tenderness.  Musculoskeletal: She exhibits no edema or tenderness.  Lymphadenopathy:    She has no cervical adenopathy.  Neurological: She is alert  and oriented to person, place, and time.  President-- "Daisy Floro, Obama, Bush" [id number] D-l-r-o-w Recall 2/3  Skin: No rash noted. No erythema.  Psychiatric: She has a normal mood and affect. Her behavior is normal.          Assessment & Plan:

## 2017-01-15 NOTE — Progress Notes (Signed)
Pre visit review using our clinic review tool, if applicable. No additional management support is needed unless otherwise documented below in the visit note. 

## 2017-01-15 NOTE — Assessment & Plan Note (Signed)
Does okay with the flonase

## 2017-01-16 DIAGNOSIS — D696 Thrombocytopenia, unspecified: Secondary | ICD-10-CM | POA: Insufficient documentation

## 2017-01-16 LAB — CBC WITH DIFFERENTIAL/PLATELET
Basophils Absolute: 0.1 10*3/uL (ref 0.0–0.1)
Basophils Relative: 1.5 % (ref 0.0–3.0)
Eosinophils Absolute: 0.1 10*3/uL (ref 0.0–0.7)
Eosinophils Relative: 1 % (ref 0.0–5.0)
HCT: 42.2 % (ref 36.0–46.0)
Hemoglobin: 14.2 g/dL (ref 12.0–15.0)
Lymphocytes Relative: 22.2 % (ref 12.0–46.0)
Lymphs Abs: 1.6 10*3/uL (ref 0.7–4.0)
MCHC: 33.6 g/dL (ref 30.0–36.0)
MCV: 91.5 fl (ref 78.0–100.0)
Monocytes Absolute: 0.3 10*3/uL (ref 0.1–1.0)
Monocytes Relative: 4.6 % (ref 3.0–12.0)
Neutro Abs: 5.1 10*3/uL (ref 1.4–7.7)
Neutrophils Relative %: 70.7 % (ref 43.0–77.0)
Platelets: 129 10*3/uL — ABNORMAL LOW (ref 150.0–400.0)
RBC: 4.61 Mil/uL (ref 3.87–5.11)
RDW: 13.6 % (ref 11.5–15.5)
WBC: 7.2 10*3/uL (ref 4.0–10.5)

## 2017-01-16 LAB — COMPREHENSIVE METABOLIC PANEL
ALT: 18 U/L (ref 0–35)
AST: 22 U/L (ref 0–37)
Albumin: 4.8 g/dL (ref 3.5–5.2)
Alkaline Phosphatase: 42 U/L (ref 39–117)
BUN: 15 mg/dL (ref 6–23)
CO2: 30 mEq/L (ref 19–32)
Calcium: 9.6 mg/dL (ref 8.4–10.5)
Chloride: 103 mEq/L (ref 96–112)
Creatinine, Ser: 0.75 mg/dL (ref 0.40–1.20)
GFR: 81.69 mL/min (ref >60.00)
Glucose, Bld: 86 mg/dL (ref 70–99)
Potassium: 4 mEq/L (ref 3.5–5.1)
Sodium: 138 mEq/L (ref 135–145)
Total Bilirubin: 0.5 mg/dL (ref 0.2–1.2)
Total Protein: 7.2 g/dL (ref 6.0–8.3)

## 2017-01-16 LAB — LIPID PANEL
Cholesterol: 257 mg/dL — ABNORMAL HIGH (ref 0–200)
HDL: 79.2 mg/dL (ref >39.00)
LDL Cholesterol: 162 mg/dL — ABNORMAL HIGH (ref 0–99)
NonHDL: 177.87
Total CHOL/HDL Ratio: 3
Triglycerides: 80 mg/dL (ref 0.0–149.0)
VLDL: 16 mg/dL (ref 0.0–40.0)

## 2017-01-16 LAB — T4, FREE: Free T4: 0.82 ng/dL (ref 0.60–1.60)

## 2017-01-16 NOTE — Assessment & Plan Note (Signed)
Labs reviewed and has persistent low platelets History of ITP No action needed at this time

## 2017-02-20 DIAGNOSIS — Z0100 Encounter for examination of eyes and vision without abnormal findings: Secondary | ICD-10-CM | POA: Diagnosis not present

## 2017-04-20 ENCOUNTER — Ambulatory Visit (INDEPENDENT_AMBULATORY_CARE_PROVIDER_SITE_OTHER): Payer: Medicare HMO | Admitting: Internal Medicine

## 2017-04-20 ENCOUNTER — Encounter: Payer: Self-pay | Admitting: Internal Medicine

## 2017-04-20 VITALS — BP 122/74 | HR 63 | Temp 97.9°F | Wt 126.0 lb

## 2017-04-20 DIAGNOSIS — W57XXXA Bitten or stung by nonvenomous insect and other nonvenomous arthropods, initial encounter: Secondary | ICD-10-CM | POA: Diagnosis not present

## 2017-04-20 DIAGNOSIS — R21 Rash and other nonspecific skin eruption: Secondary | ICD-10-CM | POA: Diagnosis not present

## 2017-04-20 NOTE — Assessment & Plan Note (Signed)
Reassured Not shingles Discussed symptomatic relief

## 2017-04-20 NOTE — Progress Notes (Signed)
   Subjective:    Patient ID: Shelby Sullivan, female    DOB: 18-Sep-1951, 66 y.o.   MRN: [id number]  HPI  Here due to a rash  Recent hiking trip so thinks it may be bug bites --the day before the rash Started 2 nights ago--is concerned about shingles (had before on left leg) Itches-- but not really painful  No treatment  Recent tick (about a week ago) Not on for long  Current Outpatient Prescriptions on File Prior to Visit  Medication Sig Dispense Refill  . fluticasone (FLONASE) 50 MCG/ACT nasal spray Place 2 sprays into both nostrils daily. 16 g 6   No current facility-administered medications on file prior to visit.     No Known Allergies  Past Medical History:  Diagnosis Date  . Allergy   . Benign tumor of breast 1996  . GERD (gastroesophageal reflux disease)   . Hyperlipidemia   . ITP (idiopathic thrombocytopenic purpura) 1997    Past Surgical History:  Procedure Laterality Date  . WRIST FRACTURE SURGERY  06/2010   right wrist    Family History  Problem Relation Age of Onset  . Hypertension Father   . Heart disease Paternal Grandfather   . Diabetes Paternal Grandfather   . Cancer Neg Hx        breast cancer  . Colon cancer Neg Hx   . Esophageal cancer Neg Hx   . Rectal cancer Neg Hx   . Stomach cancer Neg Hx     Social History   Social History  . Marital status: Married    Spouse name: N/A  . Number of children: 0  . Years of education: N/A   Occupational History  . 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 Smoking status: Never Smoker  . Smokeless tobacco: Never Used  . Alcohol use 4.2 oz/week    7 Glasses of wine per week  . Drug use: No  . Sexual activity: Not on file   Other Topics Concern  . Not on file   Social History Narrative   No living will   Would want husband as POA   Would accept resuscitation attempts   Would not want prolonged tube feeds if cognitively unaware   Review of Systems  No  fever Not sick No N/V     Objective:   Physical Exam  Constitutional: She appears well-nourished. No distress.  Skin:  2 papulovesicles over spine. (~T11, L2 Different dermatomes Red papule around lower right anterior thorax as well No other lesions          Assessment & Plan:

## 2017-06-02 DIAGNOSIS — D2272 Melanocytic nevi of left lower limb, including hip: Secondary | ICD-10-CM | POA: Diagnosis not present

## 2017-06-02 DIAGNOSIS — L814 Other melanin hyperpigmentation: Secondary | ICD-10-CM | POA: Diagnosis not present

## 2017-06-02 DIAGNOSIS — D2261 Melanocytic nevi of right upper limb, including shoulder: Secondary | ICD-10-CM | POA: Diagnosis not present

## 2017-06-02 DIAGNOSIS — D225 Melanocytic nevi of trunk: Secondary | ICD-10-CM | POA: Diagnosis not present

## 2017-07-01 DIAGNOSIS — L508 Other urticaria: Secondary | ICD-10-CM | POA: Diagnosis not present

## 2017-11-03 DIAGNOSIS — Z1231 Encounter for screening mammogram for malignant neoplasm of breast: Secondary | ICD-10-CM | POA: Diagnosis not present

## 2018-01-14 DIAGNOSIS — H2513 Age-related nuclear cataract, bilateral: Secondary | ICD-10-CM | POA: Diagnosis not present

## 2018-01-20 ENCOUNTER — Encounter: Payer: Self-pay | Admitting: Internal Medicine

## 2018-01-25 ENCOUNTER — Encounter: Payer: Self-pay | Admitting: Internal Medicine

## 2018-01-25 ENCOUNTER — Ambulatory Visit (INDEPENDENT_AMBULATORY_CARE_PROVIDER_SITE_OTHER): Payer: Medicare HMO | Admitting: Internal Medicine

## 2018-01-25 VITALS — BP 110/70 | HR 66 | Temp 97.7°F | Ht 65.5 in | Wt 116.0 lb

## 2018-01-25 DIAGNOSIS — D696 Thrombocytopenia, unspecified: Secondary | ICD-10-CM

## 2018-01-25 DIAGNOSIS — J301 Allergic rhinitis due to pollen: Secondary | ICD-10-CM

## 2018-01-25 DIAGNOSIS — K219 Gastro-esophageal reflux disease without esophagitis: Secondary | ICD-10-CM | POA: Diagnosis not present

## 2018-01-25 DIAGNOSIS — Z23 Encounter for immunization: Secondary | ICD-10-CM | POA: Diagnosis not present

## 2018-01-25 DIAGNOSIS — Z7189 Other specified counseling: Secondary | ICD-10-CM | POA: Diagnosis not present

## 2018-01-25 DIAGNOSIS — Z0000 Encounter for general adult medical examination without abnormal findings: Secondary | ICD-10-CM | POA: Diagnosis not present

## 2018-01-25 LAB — CBC
HCT: 42.7 % (ref 36.0–46.0)
Hemoglobin: 14.4 g/dL (ref 12.0–15.0)
MCHC: 33.6 g/dL (ref 30.0–36.0)
MCV: 91.6 fl (ref 78.0–100.0)
Platelets: 138 10*3/uL — ABNORMAL LOW (ref 150.0–400.0)
RBC: 4.66 Mil/uL (ref 3.87–5.11)
RDW: 13.1 % (ref 11.5–15.5)
WBC: 5.7 10*3/uL (ref 4.0–10.5)

## 2018-01-25 LAB — COMPREHENSIVE METABOLIC PANEL
ALT: 16 U/L (ref 0–35)
AST: 16 U/L (ref 0–37)
Albumin: 4.5 g/dL (ref 3.5–5.2)
Alkaline Phosphatase: 40 U/L (ref 39–117)
BUN: 17 mg/dL (ref 6–23)
CO2: 32 mEq/L (ref 19–32)
Calcium: 9.9 mg/dL (ref 8.4–10.5)
Chloride: 103 mEq/L (ref 96–112)
Creatinine, Ser: 0.66 mg/dL (ref 0.40–1.20)
GFR: 94.39 mL/min (ref >60.00)
Glucose, Bld: 96 mg/dL (ref 70–99)
Potassium: 4.3 mEq/L (ref 3.5–5.1)
Sodium: 139 mEq/L (ref 135–145)
Total Bilirubin: 0.6 mg/dL (ref 0.2–1.2)
Total Protein: 7 g/dL (ref 6.0–8.3)

## 2018-01-25 NOTE — Progress Notes (Signed)
Subjective:    Patient ID: Shelby Sullivan, female    DOB: 11/06/1951, 67 y.o.   MRN: [id number]  HPI Here for Medicare wellness visit and follow up of chronic health conditions Reviewed form and advanced directives Reviewed other doctors Still enjoys wine regularly No tobacco Exercises regularly Vision and hearing are fine No falls No depression or anhedonia Independent with instrumental ADLs No sig memory issues  Having some left hip pain If sits for a while--she notices stiffness and some pain upon first getting up No swelling Occasional trouble lying on her side No problems with exercise--even running  Hasn't needed flonase for a while Will start her allegra for spring allergy season---mostly for itchy eyes  Occasional reflux symptoms Usually goes away on its own No Rx  No abnormal bleeding or bruising Knows about the low platelets--- goes back for decades  Current Outpatient Medications on File Prior to Visit  Medication Sig Dispense Refill  . fluticasone (FLONASE) 50 MCG/ACT nasal spray Place 2 sprays into both nostrils daily. 16 g 6   No current facility-administered medications on file prior to visit.     No Known Allergies  Past Medical History:  Diagnosis Date  . Allergy   . Benign tumor of breast 1996  . GERD (gastroesophageal reflux disease)   . Hyperlipidemia   . ITP (idiopathic thrombocytopenic purpura) 1997    Past Surgical History:  Procedure Laterality Date  . WRIST FRACTURE SURGERY  06/2010   right wrist    Family History  Problem Relation Age of Onset  . Hypertension Father   . Heart disease Paternal Grandfather   . Diabetes Paternal Grandfather   . Cancer Neg Hx        breast cancer  . Colon cancer Neg Hx   . Esophageal cancer Neg Hx   . Rectal cancer Neg Hx   . Stomach cancer Neg Hx     Social History   Socioeconomic History  . Marital status: Married    Spouse name: Not on file  . Number of children: 0  . Years of  education: Not on file  . Highest education level: Not on file  Social Needs  . Financial resource strain: Not on file  . Food insecurity - worry: Not on file  . Food insecurity - inability: Not on file  . Transportation needs - medical: Not on file  . Transportation needs - non-medical: Not on file  Occupational History  . 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 Smoking status: Never Smoker  . Smokeless tobacco: Never Used  Substance and Sexual Activity  . Alcohol use: Yes    Alcohol/week: 4.2 oz    Types: 7 Glasses of wine per week  . Drug use: No  . Sexual activity: Not on file  Other Topics Concern  . Not on file  Social History Narrative   No living will   Would want husband as POA   Would accept resuscitation attempts   Would not want prolonged tube feeds if cognitively unaware   Review of Systems Appetite is good Has lost weight--relates to her fitness efforts and toning class Walks dog twice a day also Wears seat belt Sleeps well Bowels are fine. No blood No urinary problems or incontinence No other joint problems No skin lesions No dental problems lately---replacing crowns over time No chest pain No SOB     Objective:   Physical Exam  Constitutional: She is oriented to person, place, and time. She appears well-developed. No distress.  HENT:  Mouth/Throat: Oropharynx is clear and moist. No oropharyngeal exudate.  Neck: Normal Settles of motion. No thyromegaly present.  Cardiovascular: Normal rate, regular rhythm, normal heart sounds and intact distal pulses. Exam reveals no gallop.  No murmur heard. Pulmonary/Chest: Effort normal and breath sounds normal. No respiratory distress. She has no wheezes. She has no rales.  Abdominal: Soft. There is no tenderness.  Musculoskeletal: She exhibits no edema or tenderness.  Normal ROM in left hip ?slight tenderness over bursa  Lymphadenopathy:    She has no cervical  adenopathy.  Neurological: She is alert and oriented to person, place, and time.  President--"Trump, Ramonita Lab" [id number] D-l-r-o-w Recall 3/3  Skin: No rash noted. No erythema.  Psychiatric: She has a normal mood and affect. Her behavior is normal.          Assessment & Plan:

## 2018-01-25 NOTE — Progress Notes (Signed)
Hearing Screening   Method: Audiometry   125Hz 250Hz 500Hz 1000Hz 2000Hz 3000Hz 4000Hz 6000Hz 8000Hz  Right ear:   20 20 20  20    Left ear:   20 20 20  20    Vision Screening Comments: February 2019   

## 2018-01-25 NOTE — Assessment & Plan Note (Signed)
Rare symptoms Doesn't need meds

## 2018-01-25 NOTE — Assessment & Plan Note (Signed)
I have personally reviewed the Medicare Annual Wellness questionnaire and have noted 1. The patient's medical and social history 2. Their use of alcohol, tobacco or illicit drugs 3. Their current medications and supplements 4. The patient's functional ability including ADL's, fall risks, home safety risks and hearing or visual             impairment. 5. Diet and physical activities 6. Evidence for depression or mood disorders  The patients weight, height, BMI and visual acuity have been recorded in the chart I have made referrals, counseling and provided education to the patient based review of the above and I have provided the pt with a written personalized care plan for preventive services.  I have provided you with a copy of your personalized plan for preventive services. Please take the time to review along with your updated medication list.  Needs pneumovax Yearly flu vaccine shingrix recommended at pharmacy Exercises regularly Mammogram in 1/19 Colon due 2023

## 2018-01-25 NOTE — Addendum Note (Signed)
Addended by: Pilar Grammes on: 01/25/2018 04:20 PM   Modules accepted: Orders

## 2018-01-25 NOTE — Assessment & Plan Note (Signed)
Long standing and no symptoms

## 2018-01-25 NOTE — Assessment & Plan Note (Signed)
Will restart her fexofenadine soon

## 2018-01-25 NOTE — Assessment & Plan Note (Signed)
See social history 

## 2018-05-17 DIAGNOSIS — M1711 Unilateral primary osteoarthritis, right knee: Secondary | ICD-10-CM | POA: Diagnosis not present

## 2018-05-17 DIAGNOSIS — M25561 Pain in right knee: Secondary | ICD-10-CM | POA: Diagnosis not present

## 2018-06-01 DIAGNOSIS — D2272 Melanocytic nevi of left lower limb, including hip: Secondary | ICD-10-CM | POA: Diagnosis not present

## 2018-06-01 DIAGNOSIS — L821 Other seborrheic keratosis: Secondary | ICD-10-CM | POA: Diagnosis not present

## 2018-06-01 DIAGNOSIS — D2261 Melanocytic nevi of right upper limb, including shoulder: Secondary | ICD-10-CM | POA: Diagnosis not present

## 2018-06-01 DIAGNOSIS — L814 Other melanin hyperpigmentation: Secondary | ICD-10-CM | POA: Diagnosis not present

## 2018-06-01 DIAGNOSIS — D2262 Melanocytic nevi of left upper limb, including shoulder: Secondary | ICD-10-CM | POA: Diagnosis not present

## 2018-06-01 DIAGNOSIS — D225 Melanocytic nevi of trunk: Secondary | ICD-10-CM | POA: Diagnosis not present

## 2018-06-01 DIAGNOSIS — D2271 Melanocytic nevi of right lower limb, including hip: Secondary | ICD-10-CM | POA: Diagnosis not present

## 2018-12-06 ENCOUNTER — Ambulatory Visit: Payer: Medicare HMO | Admitting: Internal Medicine

## 2018-12-06 ENCOUNTER — Telehealth: Payer: Self-pay

## 2018-12-06 ENCOUNTER — Encounter: Payer: Self-pay | Admitting: Internal Medicine

## 2018-12-06 VITALS — BP 118/78 | HR 69 | Temp 98.0°F | Wt 123.0 lb

## 2018-12-06 DIAGNOSIS — Z23 Encounter for immunization: Secondary | ICD-10-CM

## 2018-12-06 DIAGNOSIS — S51812A Laceration without foreign body of left forearm, initial encounter: Secondary | ICD-10-CM | POA: Diagnosis not present

## 2018-12-06 NOTE — Progress Notes (Signed)
Subjective:    Patient ID: Shelby Sullivan, female    DOB: [birth year], 68 y.o.   MRN: [id number]  HPI  Pt presents to the clinic today with c/o laceration of left forearm. She reports this occurred 1 hour prior to arrival. She fell coming down the steps and scraped her arm on the side of a brick, and it caused the laceration. She was able to get the bleeding controlled at home. Her last tetanus was in 2011.  Review of Systems      Past Medical History:  Diagnosis Date  . Allergy   . Benign tumor of breast 1996  . GERD (gastroesophageal reflux disease)   . Hyperlipidemia   . ITP (idiopathic thrombocytopenic purpura) 1997    Current Outpatient Medications  Medication Sig Dispense Refill  . fluticasone (FLONASE) 50 MCG/ACT nasal spray Place 2 sprays into both nostrils daily. 16 g 6   No current facility-administered medications for this visit.     No Known Allergies  Family History  Problem Relation Age of Onset  . Hypertension Father   . Heart disease Paternal Grandfather   . Diabetes Paternal Grandfather   . Cancer Neg Hx        breast cancer  . Colon cancer Neg Hx   . Esophageal cancer Neg Hx   . Rectal cancer Neg Hx   . Stomach cancer Neg Hx     Social History   Socioeconomic History  . Marital status: Married    Spouse name: Not on file  . Number of children: 0  . Years of education: Not on file  . Highest education level: Not on file  Occupational History  . 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 Financial resource strain: Not on file  . Food insecurity:    Worry: Not on file    Inability: Not on file  . Transportation needs:    Medical: Not on file    Non-medical: Not on file  Tobacco Use  . Smoking status: Never Smoker  . Smokeless tobacco: Never Used  Substance and Sexual Activity  . Alcohol use: Yes    Alcohol/week: 7.0 standard drinks    Types: 7 Glasses of wine per week  . Drug use: No  .  Sexual activity: Not on file  Lifestyle  . Physical activity:    Days per week: Not on file    Minutes per session: Not on file  . Stress: Not on file  Relationships  . Social connections:    Talks on phone: Not on file    Gets together: Not on file    Attends religious service: Not on file    Active member of club or organization: Not on file    Attends meetings of clubs or organizations: Not on file    Relationship status: Not on file  . Intimate partner violence:    Fear of current or ex partner: Not on file    Emotionally abused: Not on file    Physically abused: Not on file    Forced sexual activity: Not on file  Other Topics Concern  . Not on file  Social History Narrative   No living will   Would want husband as POA   Would accept resuscitation attempts   Would not want prolonged tube feeds if cognitively unaware     Constitutional: Denies fever, malaise, fatigue, headache or abrupt weight changes.  Musculoskeletal:  Denies decrease in Buras of motion, difficulty with gait, muscle pain or joint pain and swelling.  Skin: Pt reports laceration of left forearm. Denies redness, rashes, lesions or ulcercations.  Neuro: Denies numbness, tingling or weakness of left forearm.   No other specific complaints in a complete review of systems (except as listed in HPI above).  Objective:   Physical Exam    Pulse 69   Temp 98 F (36.7 C) (Oral)   Wt 123 lb (55.8 kg)   SpO2 98%   BMI 20.16 kg/m  Wt Readings from Last 3 Encounters:  12/06/18 123 lb (55.8 kg)  01/25/18 116 lb (52.6 kg)  04/20/17 126 lb (57.2 kg)    General: Appears her stated age, in NAD. Skin: 3 cm L shaped laceration of left forearm, bleeding controlled. Musculoskeletal: Normal flexion, extension and rotation of the left elbow. No pain with palpation over the radius or ulna. Hand grips equal bilaterally. Neurological: Alert and oriented. Sensation intact to BLE.   BMET    Component Value Date/Time     NA 139 01/25/2018 1322   K 4.3 01/25/2018 1322   CL 103 01/25/2018 1322   CO2 32 01/25/2018 1322   GLUCOSE 96 01/25/2018 1322   BUN 17 01/25/2018 1322   CREATININE 0.66 01/25/2018 1322   CALCIUM 9.9 01/25/2018 1322    Lipid Panel     Component Value Date/Time   CHOL 257 (H) 01/15/2017 1624   TRIG 80.0 01/15/2017 1624   HDL 79.20 01/15/2017 1624   CHOLHDL 3 01/15/2017 1624   VLDL 16.0 01/15/2017 1624   LDLCALC 162 (H) 01/15/2017 1624    CBC    Component Value Date/Time   WBC 5.7 01/25/2018 1322   RBC 4.66 01/25/2018 1322   HGB 14.4 01/25/2018 1322   HCT 42.7 01/25/2018 1322   PLT 138.0 (L) 01/25/2018 1322   MCV 91.6 01/25/2018 1322   MCHC 33.6 01/25/2018 1322   RDW 13.1 01/25/2018 1322   LYMPHSABS 1.6 01/15/2017 1624   MONOABS 0.3 01/15/2017 1624   EOSABS 0.1 01/15/2017 1624   BASOSABS 0.1 01/15/2017 1624    Hgb A1C No results found for: HGBA1C        Assessment & Plan:   Laceration of Left Forearm:  Discussed risk of procedure including bleeding, pain, infection and poor cosmetic results. Informed consent obtained and scanned into chart Area cleansed with mixture of Betadine and Sterile Water Area numbed with 1% Lidocaine without Epi Ethilon 4-0 sutures place, # Area cleansed, TAB applied, covered with Tefla and tape Pt tolerated well No complications Aftercare instructions provided  Return to the clinic in 7 days for suture removal Webb Silversmith, NP

## 2018-12-06 NOTE — Telephone Encounter (Signed)
Pt just fell and hit arm on edge of brick;pt has got bleeding stopped; 1 1/2 - 2 " cut and a chunk of flesh appears to be missing. Pt's husband thinks needs stitches. Pt last TD 10/18/10 per immunization list. Avie Echevaria NP said pt can come at 10:30 AM. Pt voiced understanding and will come now for appt.FYI to Avie Echevaria NP.

## 2018-12-06 NOTE — Patient Instructions (Signed)
Sutured Wound Care  Sutures are stitches that can be used to close wounds. Taking care of your wound properly can help prevent pain and infection. It can also help your wound to heal more quickly. Follow instructions from your doctor about how to care for your sutured wound.  Supplies needed:  · Soap and water.  · A clean bandage (dressing), if needed.  · Antibiotic ointment.  · A clean towel.  How to care for your sutured wound    · Keep the wound completely dry for the first 24 hours or as long as told by your doctor. After 24-48 hours, you may shower or bathe as told by your doctor. Do not soak the wound or put the wound completely under water until the sutures have been removed.  · After the first 24 hours, clean the wound once a day, or as often as your doctor tells you to. Take these steps:  ? Wash the wound with soap and water.  ? Rinse the wound with water. Make sure to wash all the soap off.  ? Pat the wound dry with a clean towel. Do not rub the wound.  · After cleaning the wound, put a thin layer of antibiotic ointment on the wound as told by your doctor. This will help:  ? Prevent infection.  ? Keep the bandage from sticking to the wound.  · Follow instructions from your doctor about how to change your bandage:  ? Wash your hands with soap and water. If you cannot use soap and water, use hand sanitizer.  ? Change your bandage at least once a day, or as often as told by your doctor. If your dressing gets wet or dirty, change it.  ? Leave sutures, skin glue, or skin tape (adhesive) strips in place. They may need to stay in place for 2 weeks or longer. If tape strips get loose and curl up, you may trim the loose edges. Do not remove tape strips completely unless your doctor says it is okay.  · Check your wound every day for signs of infection. Watch for:  ? Redness, swelling, or pain.  ? Fluid or blood.  ? Warmth.  ? Pus or a bad smell.  · Have the sutures removed as told by your doctor.  Follow these  instructions at home:  Medicines  · Take or apply over-the-counter and prescription medicines only as told by your doctor.  · If you were prescribed an antibiotic medicine or ointment, take or apply it as told by your doctor. Do not stop using the antibiotic even if you start to feel better.  General instructions  · Cover your wound with clothes or put sunscreen on when you are outside. Use a sunscreen of at least 30 SPF.  · Do not scratch or pick at your wound.  · Avoid stretching your wound.  · Raise (elevate) the injured area above the level of your heart while you are sitting or lying down, if possible.  · Drink enough fluids to keep your pee (urine) clear or pale yellow.  · Keep all follow-up visits as told by your doctor. This is important.  Contact a doctor if:  · You were given a tetanus shot and you have any of the following at the site where the needle went in:  ? Swelling.  ? Very bad pain.  ? Redness.  ? Bleeding.  · Your wound breaks open.  · You have redness, swelling, or pain around your   wound.  · You have fluid or blood coming from your wound.  · Your wound feels warm to the touch.  · You have a fever.  · You notice something coming out of your wound, such as wood or glass.  · You have pain that does not get better with medicine.  · The skin near your wound changes color.  · You need to change your bandage often due to a lot of fluid, blood, or pus coming from the wound.  · You get a new rash.  · You get numbness around the wound.  Get help right away if:  · You have very bad swelling around your wound.  · You have pus or a bad smell coming from your wound.  · Your pain suddenly gets worse and is very bad.  · You have painful lumps near your wound or anywhere on your body.  · You have a red streak going away from your wound.  · The wound is on your hand or foot, and:  ? You cannot move a finger or toe as you used to do.  ? Your fingers or toes look pale or blue.  ? You have numbness that spreads down  your hand, foot, fingers, or toes.  Summary  · Sutures are stitches that are used to close wounds.  · Taking care of your wound properly can help prevent pain and infection.  · Keep the wound completely dry for the first 24 hours or for as long as told by your doctor. After 24-48 hours, you may shower or bathe as directed by your doctor.  This information is not intended to replace advice given to you by your health care provider. Make sure you discuss any questions you have with your health care provider.  Document Released: 04/21/2008 Document Revised: 12/09/2016 Document Reviewed: 12/09/2016  Elsevier Interactive Patient Education © 2019 Elsevier Inc.

## 2018-12-07 NOTE — Addendum Note (Signed)
Addended by: Lurlean Nanny on: 12/07/2018 09:08 AM   Modules accepted: Orders

## 2018-12-13 ENCOUNTER — Ambulatory Visit: Payer: Managed Care, Other (non HMO) | Admitting: Internal Medicine

## 2018-12-13 ENCOUNTER — Encounter: Payer: Self-pay | Admitting: Internal Medicine

## 2018-12-13 VITALS — BP 112/76 | HR 64 | Temp 97.7°F | Wt 124.0 lb

## 2018-12-13 DIAGNOSIS — Z4802 Encounter for removal of sutures: Secondary | ICD-10-CM

## 2018-12-13 DIAGNOSIS — S51812D Laceration without foreign body of left forearm, subsequent encounter: Secondary | ICD-10-CM | POA: Diagnosis not present

## 2018-12-13 NOTE — Patient Instructions (Signed)
Sterile Tape Wound Care  Some cuts and wounds can be closed using sterile tape, also called skin adhesive strips. Skin adhesive strips can be used for shallow (superficial) and simple cuts, wounds, lacerations, and some surgical incisions. These strips act in place of stitches, or in addition to stitches, to hold the edges of the wound together to allow for better healing. Unlike stitches, the adhesive strips do not require needles or anesthetic medicine for placement. The strips usually fall off on their own as the wound is healing. It is important to take proper care of your wound at home while it heals.  How to care for a sterile tape wound     Try to keep the area around your wound clean and dry. Do not allow the adhesive strips to get wet for the first 12 hours.   Do not use any soaps or ointments on the wound for the first 12 hours.   If a bandage (dressing) has been applied, keep it dry.   Follow instructions from your health care provider about how often to change the dressing.  ? Wash your hands with soap and water before you change your dressing. If soap and water are not available, use hand sanitizer.  ? Change your dressing as told by your health care provider.  ? Leave adhesive strips in place. These skin closures may need to stay in place for 2 weeks or longer. If adhesive strip edges start to loosen and curl up, you may trim the loose edges. Do not remove adhesive strips completely unless your health care provider tells you to do that.   Do not scratch, rub, or pick at the wound area.   Protect the wound from further injury until it is healed.   Protect the wound from sun and tanning bed exposure while it is healing, and for several weeks after healing.   Check the wound every day for signs of infection. Check for:  ? More redness, swelling, or pain.  ? More fluid or blood.  ? Warmth.  ? Pus or a bad smell.  Follow these instructions at home:   Take over-the-counter and prescription medicines  only as told by your health care provider.   Keep all follow-up visits as told by your health care provider. This is important.  Contact a health care provider if:   Your adhesive strips become soaked with blood or fall off before the wound has healed. The tape will need to be replaced.   You have a fever.  Get help right away if:   You have chills.   You develop a rash after the strips are applied.   You have a red streak that goes away from the wound.   You have more redness, swelling, or pain around your wound.   You have more fluid or blood coming from your wound.   Your wound feels warm to the touch.   You have pus or a bad smell coming from your wound.   Your wound breaks open.  This information is not intended to replace advice given to you by your health care provider. Make sure you discuss any questions you have with your health care provider.  Document Released: 12/11/2004 Document Revised: 09/26/2016 Document Reviewed: 09/26/2016  Elsevier Interactive Patient Education  2019 Elsevier Inc.

## 2018-12-13 NOTE — Progress Notes (Signed)
Subjective:    Patient ID: Shelby Sullivan, female    DOB: 03/08/1951, 68 y.o.   MRN: [id number]  HPI  Pt presents to the clinic today for suture removal. She cut her arm on a brick 1 week ago. She received 4 sutures. She reports they seem to be healing well. She has noticed a little redness, but no warmth or drainage from the area. She has been putting Neosporin on it 2 x day.  Review of Systems      Past Medical History:  Diagnosis Date  . Allergy   . Benign tumor of breast 1996  . GERD (gastroesophageal reflux disease)   . Hyperlipidemia   . ITP (idiopathic thrombocytopenic purpura) 1997    Current Outpatient Medications  Medication Sig Dispense Refill  . fluticasone (FLONASE) 50 MCG/ACT nasal spray Place 2 sprays into both nostrils daily. 16 g 6   No current facility-administered medications for this visit.     No Known Allergies  Family History  Problem Relation Age of Onset  . Hypertension Father   . Heart disease Paternal Grandfather   . Diabetes Paternal Grandfather   . Cancer Neg Hx        breast cancer  . Colon cancer Neg Hx   . Esophageal cancer Neg Hx   . Rectal cancer Neg Hx   . Stomach cancer Neg Hx     Social History   Socioeconomic History  . Marital status: Married    Spouse name: Not on file  . Number of children: 0  . Years of education: Not on file  . Highest education level: Not on file  Occupational History  . 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 Financial resource strain: Not on file  . Food insecurity:    Worry: Not on file    Inability: Not on file  . Transportation needs:    Medical: Not on file    Non-medical: Not on file  Tobacco Use  . Smoking status: Never Smoker  . Smokeless tobacco: Never Used  Substance and Sexual Activity  . Alcohol use: Yes    Alcohol/week: 7.0 standard drinks    Types: 7 Glasses of wine per week  . Drug use: No  . Sexual activity: Not on file    Lifestyle  . Physical activity:    Days per week: Not on file    Minutes per session: Not on file  . Stress: Not on file  Relationships  . Social connections:    Talks on phone: Not on file    Gets together: Not on file    Attends religious service: Not on file    Active member of club or organization: Not on file    Attends meetings of clubs or organizations: Not on file    Relationship status: Not on file  . Intimate partner violence:    Fear of current or ex partner: Not on file    Emotionally abused: Not on file    Physically abused: Not on file    Forced sexual activity: Not on file  Other Topics Concern  . Not on file  Social History Narrative   No living will   Would want husband as POA   Would accept resuscitation attempts   Would not want prolonged tube feeds if cognitively unaware     Constitutional: Denies fever, malaise, fatigue, headache or abrupt weight changes.  Respiratory: Denies difficulty  breathing, shortness of breath, cough or sputum production.   Cardiovascular: Denies chest pain, chest tightness, palpitations or swelling in the hands or feet.  Skin: Pt reports sutured incision to left posterior forearm. Denies rashes, lesions or ulcercations.   No other specific complaints in a complete review of systems (except as listed in HPI above).  Objective:   Physical Exam   BP 112/76   Pulse 64   Temp 97.7 F (36.5 C) (Oral)   Wt 124 lb (56.2 kg)   SpO2 98%   BMI 20.32 kg/m  Wt Readings from Last 3 Encounters:  12/13/18 124 lb (56.2 kg)  12/06/18 123 lb (55.8 kg)  01/25/18 116 lb (52.6 kg)    General: Appears her stated age, well developed, well nourished in NAD. Skin: Warm, dry and intact. L shaped incision to posterior forearm intact, scabbed. Mild surrounding redness but no concern for infection. Musculoskeletal: Normal flexion and extension of the left elbow.  Neurological: Alert and oriented. Sensation intact to LUE.   BMET     Component Value Date/Time   NA 139 01/25/2018 1322   K 4.3 01/25/2018 1322   CL 103 01/25/2018 1322   CO2 32 01/25/2018 1322   GLUCOSE 96 01/25/2018 1322   BUN 17 01/25/2018 1322   CREATININE 0.66 01/25/2018 1322   CALCIUM 9.9 01/25/2018 1322    Lipid Panel     Component Value Date/Time   CHOL 257 (H) 01/15/2017 1624   TRIG 80.0 01/15/2017 1624   HDL 79.20 01/15/2017 1624   CHOLHDL 3 01/15/2017 1624   VLDL 16.0 01/15/2017 1624   LDLCALC 162 (H) 01/15/2017 1624    CBC    Component Value Date/Time   WBC 5.7 01/25/2018 1322   RBC 4.66 01/25/2018 1322   HGB 14.4 01/25/2018 1322   HCT 42.7 01/25/2018 1322   PLT 138.0 (L) 01/25/2018 1322   MCV 91.6 01/25/2018 1322   MCHC 33.6 01/25/2018 1322   RDW 13.1 01/25/2018 1322   LYMPHSABS 1.6 01/15/2017 1624   MONOABS 0.3 01/15/2017 1624   EOSABS 0.1 01/15/2017 1624   BASOSABS 0.1 01/15/2017 1624    Hgb A1C No results found for: HGBA1C         Assessment & Plan:   Encounter for Suture Removal:  Sutures removed Pt tolerated well No complications 2 steri strips placed over healing laceration for extra support Aftercare instructions provided  RTC as needed Webb Silversmith, NP

## 2019-01-28 ENCOUNTER — Encounter: Payer: Self-pay | Admitting: Internal Medicine

## 2019-02-11 ENCOUNTER — Encounter: Payer: Self-pay | Admitting: Internal Medicine

## 2019-04-01 DIAGNOSIS — H5213 Myopia, bilateral: Secondary | ICD-10-CM | POA: Diagnosis not present

## 2019-05-31 DIAGNOSIS — D225 Melanocytic nevi of trunk: Secondary | ICD-10-CM | POA: Diagnosis not present

## 2019-05-31 DIAGNOSIS — D1722 Benign lipomatous neoplasm of skin and subcutaneous tissue of left arm: Secondary | ICD-10-CM | POA: Diagnosis not present

## 2019-05-31 DIAGNOSIS — D2272 Melanocytic nevi of left lower limb, including hip: Secondary | ICD-10-CM | POA: Diagnosis not present

## 2019-05-31 DIAGNOSIS — D2261 Melanocytic nevi of right upper limb, including shoulder: Secondary | ICD-10-CM | POA: Diagnosis not present

## 2019-05-31 DIAGNOSIS — D2271 Melanocytic nevi of right lower limb, including hip: Secondary | ICD-10-CM | POA: Diagnosis not present

## 2019-05-31 DIAGNOSIS — D2262 Melanocytic nevi of left upper limb, including shoulder: Secondary | ICD-10-CM | POA: Diagnosis not present

## 2019-05-31 DIAGNOSIS — L821 Other seborrheic keratosis: Secondary | ICD-10-CM | POA: Diagnosis not present

## 2019-07-02 DIAGNOSIS — L089 Local infection of the skin and subcutaneous tissue, unspecified: Secondary | ICD-10-CM | POA: Diagnosis not present

## 2019-07-02 DIAGNOSIS — S40861A Insect bite (nonvenomous) of right upper arm, initial encounter: Secondary | ICD-10-CM | POA: Diagnosis not present

## 2019-07-02 DIAGNOSIS — W57XXXA Bitten or stung by nonvenomous insect and other nonvenomous arthropods, initial encounter: Secondary | ICD-10-CM | POA: Diagnosis not present

## 2019-07-05 DIAGNOSIS — L239 Allergic contact dermatitis, unspecified cause: Secondary | ICD-10-CM | POA: Diagnosis not present

## 2019-07-05 DIAGNOSIS — S60861A Insect bite (nonvenomous) of right wrist, initial encounter: Secondary | ICD-10-CM | POA: Diagnosis not present

## 2019-07-20 ENCOUNTER — Ambulatory Visit (INDEPENDENT_AMBULATORY_CARE_PROVIDER_SITE_OTHER): Payer: Medicare HMO | Admitting: Internal Medicine

## 2019-07-20 ENCOUNTER — Other Ambulatory Visit: Payer: Self-pay

## 2019-07-20 ENCOUNTER — Encounter: Payer: Self-pay | Admitting: Internal Medicine

## 2019-07-20 VITALS — BP 132/86 | HR 67 | Temp 97.3°F | Ht 65.5 in | Wt 121.0 lb

## 2019-07-20 DIAGNOSIS — J301 Allergic rhinitis due to pollen: Secondary | ICD-10-CM

## 2019-07-20 DIAGNOSIS — E785 Hyperlipidemia, unspecified: Secondary | ICD-10-CM

## 2019-07-20 DIAGNOSIS — Z0000 Encounter for general adult medical examination without abnormal findings: Secondary | ICD-10-CM | POA: Diagnosis not present

## 2019-07-20 DIAGNOSIS — Z7189 Other specified counseling: Secondary | ICD-10-CM

## 2019-07-20 DIAGNOSIS — D696 Thrombocytopenia, unspecified: Secondary | ICD-10-CM

## 2019-07-20 DIAGNOSIS — Z23 Encounter for immunization: Secondary | ICD-10-CM | POA: Diagnosis not present

## 2019-07-20 LAB — COMPREHENSIVE METABOLIC PANEL
ALT: 15 U/L (ref 0–35)
AST: 19 U/L (ref 0–37)
Albumin: 4.4 g/dL (ref 3.5–5.2)
Alkaline Phosphatase: 47 U/L (ref 39–117)
BUN: 13 mg/dL (ref 6–23)
CO2: 30 mEq/L (ref 19–32)
Calcium: 9.5 mg/dL (ref 8.4–10.5)
Chloride: 105 mEq/L (ref 96–112)
Creatinine, Ser: 0.71 mg/dL (ref 0.40–1.20)
GFR: 81.28 mL/min (ref >60.00)
Glucose, Bld: 96 mg/dL (ref 70–99)
Potassium: 4.7 mEq/L (ref 3.5–5.1)
Sodium: 140 mEq/L (ref 135–145)
Total Bilirubin: 0.6 mg/dL (ref 0.2–1.2)
Total Protein: 6.8 g/dL (ref 6.0–8.3)

## 2019-07-20 LAB — LIPID PANEL
Cholesterol: 224 mg/dL — ABNORMAL HIGH (ref 0–200)
HDL: 67.6 mg/dL (ref >39.00)
LDL Cholesterol: 140 mg/dL — ABNORMAL HIGH (ref 0–99)
NonHDL: 156.25
Total CHOL/HDL Ratio: 3
Triglycerides: 83 mg/dL (ref 0.0–149.0)
VLDL: 16.6 mg/dL (ref 0.0–40.0)

## 2019-07-20 LAB — CBC
HCT: 41.7 % (ref 36.0–46.0)
Hemoglobin: 13.9 g/dL (ref 12.0–15.0)
MCHC: 33.4 g/dL (ref 30.0–36.0)
MCV: 91.8 fl (ref 78.0–100.0)
Platelets: 132 10*3/uL — ABNORMAL LOW (ref 150.0–400.0)
RBC: 4.54 Mil/uL (ref 3.87–5.11)
RDW: 13 % (ref 11.5–15.5)
WBC: 5.1 10*3/uL (ref 4.0–10.5)

## 2019-07-20 NOTE — Assessment & Plan Note (Signed)
Moderate total but low risk with high HDL No statin for now

## 2019-07-20 NOTE — Progress Notes (Signed)
Subjective:    Patient ID: Shelby Sullivan, female    DOB: 1951/05/24, 68 y.o.   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 No major depression or anhedonia--just situational Independent with instrumental ADLs No apparent memory problems  Doing okay with COVID Still plays tennis Does her shopping on non peak times  Still gets occasional sharp pain just below xiphoid Will eat light then--but not clearly food related No clear heartburn--no dysphagia  Allergies haven't been as bad No meds in past year  No abnormal bruising or bleeding Known low platelets  Known high cholesterol Also has high HDL though Stays fit  Had insect bite on right wrist 2 weeks ago Looked very red Got bactrim but had bad reaction right away and stopped (headache, nausea) Now using topical Rx and it is better  Current Outpatient Medications on File Prior to Visit  Medication Sig Dispense Refill  . fluticasone (FLONASE) 50 MCG/ACT nasal spray Place 2 sprays into both nostrils daily. (Patient not taking: Reported on 07/20/2019) 16 g 6   No current facility-administered medications on file prior to visit.     Allergies  Allergen Reactions  . Sulfa Antibiotics     Headaches, malacia, nausea    Past Medical History:  Diagnosis Date  . Allergy   . Benign tumor of breast 1996  . GERD (gastroesophageal reflux disease)   . Hyperlipidemia   . ITP (idiopathic thrombocytopenic purpura) 1997    Past Surgical History:  Procedure Laterality Date  . WRIST FRACTURE SURGERY  06/2010   right wrist    Family History  Problem Relation Age of Onset  . Hypertension Father   . Heart disease  Paternal Grandfather   . Diabetes Paternal Grandfather   . Thyroid nodules Mother   . Hyperparathyroidism Sister   . Cancer Neg Hx        breast cancer  . Colon cancer Neg Hx   . Esophageal cancer Neg Hx   . Rectal cancer Neg Hx   . Stomach cancer Neg Hx     Social History   Socioeconomic History  . Marital status: Married    Spouse name: Not on file  . Number of children: 0  . Years of education: Not on file  . Highest education level: Not on file  Occupational History  . 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 Financial resource strain: Not on file  . Food insecurity    Worry: Not on file    Inability: Not on file  . Transportation needs    Medical: Not on file    Non-medical: Not on file  Tobacco Use  . Smoking status: Never Smoker  . Smokeless tobacco: Never Used  Substance and Sexual Activity  . Alcohol use: Yes    Alcohol/week: 7.0 standard drinks    Types: 7 Glasses of wine per week  . Drug use: No  . Sexual activity: Not on file  Lifestyle  . Physical activity    Days per week: Not on file    Minutes per session: Not on file  . Stress: Not on file  Relationships  . Social connections  Talks on phone: Not on file    Gets together: Not on file    Attends religious service: Not on file    Active member of club or organization: Not on file    Attends meetings of clubs or organizations: Not on file    Relationship status: Not on file  . Intimate partner violence    Fear of current or ex partner: Not on file    Emotionally abused: Not on file    Physically abused: Not on file    Forced sexual activity: Not on file  Other Topics Concern  . Not on file  Social History Narrative   No living will   Would want husband as POA   Would accept resuscitation attempts   Would not want prolonged tube feeds if cognitively unaware   Review of Systems Appetite is good Weight relatively stable Sleeps fine  Wears seat belt Teeth okay --getting new mouth guard for grinding No rash or suspicious lesions Bowels are fine--no blood Voids well. No incontinence No sig back or joint pain     Objective:   Physical Exam  Constitutional: She is oriented to person, place, and time. She appears well-developed. No distress.  HENT:  Mouth/Throat: Oropharynx is clear and moist. No oropharyngeal exudate.  Neck: No thyromegaly present.  Cardiovascular: Normal rate, regular rhythm, normal heart sounds and intact distal pulses. Exam reveals no gallop.  No murmur heard. Respiratory: Effort normal and breath sounds normal. No respiratory distress. She has no wheezes. She has no rales.  GI: Soft. There is no abdominal tenderness.  Musculoskeletal:        General: No tenderness or edema.  Lymphadenopathy:    She has no cervical adenopathy.  Neurological: She is alert and oriented to person, place, and time.  President--- "Dwaine Deter, [place]" [phone] [id number] Recall 3/3  Skin: No rash noted. No erythema.  Psychiatric: She has a normal mood and affect. Her behavior is normal.           Assessment & Plan:

## 2019-07-20 NOTE — Assessment & Plan Note (Signed)
See social history 

## 2019-07-20 NOTE — Assessment & Plan Note (Signed)
Better now Hasn't been using the flonase

## 2019-07-20 NOTE — Assessment & Plan Note (Signed)
Chronic and mild  Will recheck

## 2019-07-20 NOTE — Assessment & Plan Note (Signed)
I have personally reviewed the Medicare Annual Wellness questionnaire and have noted 1. The patient's medical and social history 2. Their use of alcohol, tobacco or illicit drugs 3. Their current medications and supplements 4. The patient's functional ability including ADL's, fall risks, home safety risks and hearing or visual             impairment. 5. Diet and physical activities 6. Evidence for depression or mood disorders  The patients weight, height, BMI and visual acuity have been recorded in the chart I have made referrals, counseling and provided education to the patient based review of the above and I have provided the pt with a written personalized care plan for preventive services.  I have provided you with a copy of your personalized plan for preventive services. Please take the time to review along with your updated medication list.  Will take the flu vaccine Consider shingrix at pharmacy Mammogram due this year Colon due 2023 Does exercise regularly

## 2019-07-20 NOTE — Addendum Note (Signed)
Addended by: Pilar Grammes on: 07/20/2019 12:41 PM   Modules accepted: Orders

## 2019-07-20 NOTE — Progress Notes (Signed)
Hearing Screening   Method: Audiometry   125Hz  250Hz  500Hz  1000Hz  2000Hz  3000Hz  4000Hz  6000Hz  8000Hz   Right ear:   20 20 20  20     Left ear:   20 20 20  20     Vision Screening Comments: May 2020

## 2019-07-28 ENCOUNTER — Encounter: Payer: Self-pay | Admitting: Internal Medicine

## 2019-12-09 ENCOUNTER — Ambulatory Visit: Payer: Medicare HMO | Attending: Internal Medicine

## 2019-12-09 DIAGNOSIS — Z23 Encounter for immunization: Secondary | ICD-10-CM | POA: Insufficient documentation

## 2019-12-09 NOTE — Progress Notes (Signed)
   Covid-19 Vaccination Clinic  Name:  Shelby Sullivan    MRN: [id number] DOB: 12-May-1951  12/09/2019  [name] was observed post Covid-19 immunization for 15 minutes without incidence. She was provided with Vaccine Information Sheet and instruction to access the V-Safe system.   [name] was instructed to call 911 with any severe reactions post vaccine: Marland Kitchen Difficulty breathing  . Swelling of your face and throat  . A fast heartbeat  . A bad rash all over your body  . Dizziness and weakness    Immunizations Administered    Name Date Dose VIS Date Route   Pfizer COVID-19 Vaccine 12/09/2019  3:41 PM 0.3 mL 10/28/2019 Intramuscular   Manufacturer: [place]   Lot: [id number]   [place]: [id number]

## 2019-12-30 ENCOUNTER — Ambulatory Visit: Payer: Medicare HMO | Attending: Internal Medicine

## 2019-12-30 DIAGNOSIS — Z23 Encounter for immunization: Secondary | ICD-10-CM

## 2019-12-30 NOTE — Progress Notes (Signed)
   Covid-19 Vaccination Clinic  Name:  Shelby Sullivan    MRN: [id number] DOB: Apr 17, 1951  12/30/2019  Shelby Sullivan was observed post Covid-19 immunization for 15 minutes without incidence. She was provided with Vaccine Information Sheet and instruction to access the V-Safe system.   Shelby Sullivan was instructed to call 911 with any severe reactions post vaccine: Marland Kitchen Difficulty breathing  . Swelling of your face and throat  . A fast heartbeat  . A bad rash all over your body  . Dizziness and weakness    Immunizations Administered    Name Date Dose VIS Date Route   Pfizer COVID-19 Vaccine 12/30/2019  1:40 PM 0.3 mL 10/28/2019 Intramuscular   Manufacturer: [place]   Lot: [id number]   [place]: [id number]

## 2020-07-25 ENCOUNTER — Other Ambulatory Visit: Payer: Self-pay

## 2020-07-25 ENCOUNTER — Ambulatory Visit: Payer: Medicare HMO | Admitting: Internal Medicine

## 2020-07-25 ENCOUNTER — Encounter: Payer: Self-pay | Admitting: Internal Medicine

## 2020-07-25 VITALS — BP 120/82 | HR 71 | Temp 97.7°F | Ht 65.25 in | Wt 121.0 lb

## 2020-07-25 DIAGNOSIS — R0602 Shortness of breath: Secondary | ICD-10-CM

## 2020-07-25 DIAGNOSIS — Z780 Asymptomatic menopausal state: Secondary | ICD-10-CM

## 2020-07-25 DIAGNOSIS — Z0000 Encounter for general adult medical examination without abnormal findings: Secondary | ICD-10-CM

## 2020-07-25 DIAGNOSIS — D696 Thrombocytopenia, unspecified: Secondary | ICD-10-CM | POA: Diagnosis not present

## 2020-07-25 DIAGNOSIS — K219 Gastro-esophageal reflux disease without esophagitis: Secondary | ICD-10-CM | POA: Diagnosis not present

## 2020-07-25 DIAGNOSIS — Z7189 Other specified counseling: Secondary | ICD-10-CM

## 2020-07-25 DIAGNOSIS — Z23 Encounter for immunization: Secondary | ICD-10-CM

## 2020-07-25 DIAGNOSIS — E785 Hyperlipidemia, unspecified: Secondary | ICD-10-CM

## 2020-07-25 DIAGNOSIS — J301 Allergic rhinitis due to pollen: Secondary | ICD-10-CM

## 2020-07-25 LAB — COMPREHENSIVE METABOLIC PANEL
ALT: 22 U/L (ref 0–35)
AST: 24 U/L (ref 0–37)
Albumin: 4.7 g/dL (ref 3.5–5.2)
Alkaline Phosphatase: 46 U/L (ref 39–117)
BUN: 14 mg/dL (ref 6–23)
CO2: 31 mEq/L (ref 19–32)
Calcium: 9.5 mg/dL (ref 8.4–10.5)
Chloride: 103 mEq/L (ref 96–112)
Creatinine, Ser: 0.67 mg/dL (ref 0.40–1.20)
GFR: 86.65 mL/min (ref >60.00)
Glucose, Bld: 86 mg/dL (ref 70–99)
Potassium: 4.4 mEq/L (ref 3.5–5.1)
Sodium: 139 mEq/L (ref 135–145)
Total Bilirubin: 0.7 mg/dL (ref 0.2–1.2)
Total Protein: 6.6 g/dL (ref 6.0–8.3)

## 2020-07-25 LAB — CBC
HCT: 41.2 % (ref 36.0–46.0)
Hemoglobin: 13.9 g/dL (ref 12.0–15.0)
MCHC: 33.8 g/dL (ref 30.0–36.0)
MCV: 92 fl (ref 78.0–100.0)
Platelets: 131 10*3/uL — ABNORMAL LOW (ref 150.0–400.0)
RBC: 4.48 Mil/uL (ref 3.87–5.11)
RDW: 13.3 % (ref 11.5–15.5)
WBC: 5 10*3/uL (ref 4.0–10.5)

## 2020-07-25 LAB — LIPID PANEL
Cholesterol: 217 mg/dL — ABNORMAL HIGH (ref 0–200)
HDL: 72.1 mg/dL (ref >39.00)
LDL Cholesterol: 131 mg/dL — ABNORMAL HIGH (ref 0–99)
NonHDL: 145.12
Total CHOL/HDL Ratio: 3
Triglycerides: 73 mg/dL (ref 0.0–149.0)
VLDL: 14.6 mg/dL (ref 0.0–40.0)

## 2020-07-25 LAB — VITAMIN D 25 HYDROXY (VIT D DEFICIENCY, FRACTURES): VITD: 35.24 ng/mL (ref 30.00–100.00)

## 2020-07-25 NOTE — Addendum Note (Signed)
Addended by: Pilar Grammes on: 07/25/2020 10:46 AM   Modules accepted: Orders

## 2020-07-25 NOTE — Assessment & Plan Note (Signed)
Discussed considering medication for this----loratadine more regular when outside this fall

## 2020-07-25 NOTE — Assessment & Plan Note (Signed)
Mild and good HDL Will recheck but not excited about statin

## 2020-07-25 NOTE — Assessment & Plan Note (Signed)
I have personally reviewed the Medicare Annual Wellness questionnaire and have noted 1. The patient's medical and social history 2. Their use of alcohol, tobacco or illicit drugs 3. Their current medications and supplements 4. The patient's functional ability including ADL's, fall risks, home safety risks and hearing or visual             impairment. 5. Diet and physical activities 6. Evidence for depression or mood disorders  The patients weight, height, BMI and visual acuity have been recorded in the chart I have made referrals, counseling and provided education to the patient based review of the above and I have provided the pt with a written personalized care plan for preventive services.  I have provided you with a copy of your personalized plan for preventive services. Please take the time to review along with your updated medication list.  Will update flu vaccine today Colon due 2023 Recent mammogram--due again 2023 Will check DEXA Recommended multivitamin Exercises regularly

## 2020-07-25 NOTE — Assessment & Plan Note (Signed)
Chronic and mild No abnormal bleeding/bruising Will check labs

## 2020-07-25 NOTE — Assessment & Plan Note (Signed)
Had single episode playing tennis yesterday Was able to play through it No other symptoms Would plan cardiology evaluation if recurs

## 2020-07-25 NOTE — Patient Instructions (Signed)
Please call radiology at [facility] to set up the DEXA (bone density) scan.

## 2020-07-25 NOTE — Progress Notes (Signed)
Hearing Screening   Method: Audiometry   125Hz  250Hz  500Hz  1000Hz  2000Hz  3000Hz  4000Hz  6000Hz  8000Hz   Right ear:   20 20 20  20     Left ear:   20 25 20  25     Vision Screening Comments: May 2021

## 2020-07-25 NOTE — Assessment & Plan Note (Signed)
See social history 

## 2020-07-25 NOTE — Assessment & Plan Note (Signed)
Has been okay without medications

## 2020-07-25 NOTE — Progress Notes (Signed)
Subjective:    Patient ID: Shelby Sullivan, female    DOB: 1951/10/18, 69 y.o.   MRN: [id number]  HPI Here for Medicare wellness visit and follow up of chronic health conditions This visit occurred during the SARS-CoV-2 public health emergency.  Safety protocols were in place, including screening questions prior to the visit, additional usage of staff PPE, and extensive cleaning of exam room while observing appropriate contact time as indicated for disinfecting solutions.   Reviewed form and advanced directives Reviewed other doctors Vision is off some ---going back to eye doctor Hearing is okay Will have up to 1 drink a day No tobacco Keeps up with regular exercise No falls in past year No depression or anhedonia Independent with instrumental ADLs No sig memory issues--just recall (names)  Known high cholesterol Not excited about statins  Known mild low platelets No abnormal bleeding or bruising  No recent acid symptoms No meds No dysphagia  Did have one spell of trouble catching breath--yesterday Noticed with exertion at tennis tournament Did finally resolve---didn't have to stop No chest pain No palpitations  Current Outpatient Medications on File Prior to Visit  Medication Sig Dispense Refill  . fluticasone (FLONASE) 50 MCG/ACT nasal spray Place 2 sprays into both nostrils daily. (Patient not taking: Reported on 07/20/2019) 16 g 6   No current facility-administered medications on file prior to visit.    Allergies  Allergen Reactions  . Sulfa Antibiotics     Headaches, malacia, nausea    Past Medical History:  Diagnosis Date  . Allergy   . Benign tumor of breast 1996  . GERD (gastroesophageal reflux disease)   . Hyperlipidemia   . ITP (idiopathic thrombocytopenic purpura) 1997    Past Surgical History:  Procedure Laterality Date  . WRIST FRACTURE SURGERY  06/2010   right wrist    Family History  Problem Relation Age of Onset  . Hypertension Father    . Heart disease Paternal Grandfather   . Diabetes Paternal Grandfather   . Thyroid nodules Mother   . Hyperparathyroidism Sister   . Cancer Neg Hx        breast cancer  . Colon cancer Neg Hx   . Esophageal cancer Neg Hx   . Rectal cancer Neg Hx   . Stomach cancer Neg Hx     Social History   Socioeconomic History  . Marital status: Married    Spouse name: Not on file  . Number of children: 0  . Years of education: Not on file  . Highest education level: Not on file  Occupational History  . 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 Smoking status: Never Smoker  . Smokeless tobacco: Never Used  Substance and Sexual Activity  . Alcohol use: Yes    Alcohol/week: 7.0 standard drinks    Types: 7 Glasses of wine per week  . Drug use: No  . Sexual activity: Not on file  Other Topics Concern  . Not on file  Social History Narrative   No living will   Would want husband as POA   Would accept resuscitation attempts   Would not want prolonged tube feeds if cognitively unaware   Social Determinants of Health   Financial Resource Strain:   . Difficulty of Paying Living Expenses: Not on file  Food Insecurity:   . Worried About Charity fundraiser in the Last Year: Not on file  . Ran  Out of Food in the Last Year: Not on file  Transportation Needs:   . Lack of Transportation (Medical): Not on file  . Lack of Transportation (Non-Medical): Not on file  Physical Activity:   . Days of Exercise per Week: Not on file  . Minutes of Exercise per Session: Not on file  Stress:   . Feeling of Stress : Not on file  Social Connections:   . Frequency of Communication with Friends and Family: Not on file  . Frequency of Social Gatherings with Friends and Family: Not on file  . Attends Religious Services: Not on file  . Active Member of Clubs or Organizations: Not on file  . Attends Archivist Meetings: Not on file  . Marital  Status: Not on file  Intimate Partner Violence:   . Fear of Current or Ex-Partner: Not on file  . Emotionally Abused: Not on file  . Physically Abused: Not on file  . Sexually Abused: Not on file   Review of Systems Wonders about checking vitamin D levels---discussed multivitamin Appetite is good Weight is stable Wears seat belt Sleeps well Teeth okay--keeps up No suspicious skin lesions now---sees NP at [facility] No dizziness or syncope Bowels are fine--no blood Voids fine---no incontinence No sig back or joint pains    Objective:   Physical Exam Constitutional:      Appearance: Normal appearance.  HENT:     Mouth/Throat:     Comments: No oral lesions Eyes:     Conjunctiva/sclera: Conjunctivae normal.     Pupils: Pupils are equal, round, and reactive to light.  Cardiovascular:     Rate and Rhythm: Normal rate and regular rhythm.     Pulses: Normal pulses.     Heart sounds: Normal heart sounds. No murmur heard.  No gallop.      Comments: Rate ~60 Pulmonary:     Effort: Pulmonary effort is normal.     Breath sounds: Normal breath sounds. No wheezing or rales.  Abdominal:     Palpations: Abdomen is soft.     Tenderness: There is no abdominal tenderness.  Musculoskeletal:     Cervical back: Neck supple.     Right lower leg: No edema.     Left lower leg: No edema.  Lymphadenopathy:     Cervical: No cervical adenopathy.  Skin:    General: Skin is warm.     Findings: No rash.  Neurological:     Mental Status: She is alert and oriented to person, place, and time.     Comments: President--- "Zoila Shutter, Obama" [id number] Recall 3/3  Psychiatric:        Mood and Affect: Mood normal.        Behavior: Behavior normal.            Assessment & Plan:

## 2020-08-14 ENCOUNTER — Ambulatory Visit: Payer: Medicare HMO | Attending: Internal Medicine

## 2020-08-14 DIAGNOSIS — Z23 Encounter for immunization: Secondary | ICD-10-CM

## 2020-08-14 NOTE — Progress Notes (Signed)
   Covid-19 Vaccination Clinic  Name:  Shelby Sullivan    MRN: [id number] DOB: May 14, 1951  08/14/2020  Shelby Sullivan was observed post Covid-19 immunization for 15 minutes without incident. She was provided with Vaccine Information Sheet and instruction to access the V-Safe system.   Shelby Sullivan was instructed to call 911 with any severe reactions post vaccine: Marland Kitchen Difficulty breathing  . Swelling of face and throat  . A fast heartbeat  . A bad rash all over body  . Dizziness and weakness

## 2020-09-10 ENCOUNTER — Ambulatory Visit
Admission: RE | Admit: 2020-09-10 | Discharge: 2020-09-10 | Disposition: A | Discharge status: Home / Self Care | Payer: Medicare HMO | Source: Ambulatory Visit | Attending: Internal Medicine | Admitting: Internal Medicine

## 2020-09-10 ENCOUNTER — Other Ambulatory Visit: Payer: Self-pay

## 2020-09-10 DIAGNOSIS — Z780 Asymptomatic menopausal state: Secondary | ICD-10-CM | POA: Diagnosis present

## 2020-09-25 ENCOUNTER — Emergency Department
Admission: EM | Admit: 2020-09-25 | Discharge: 2020-09-25 | Disposition: A | Discharge status: Home / Self Care | Payer: Medicare HMO | Attending: Emergency Medicine | Admitting: Emergency Medicine

## 2020-09-25 ENCOUNTER — Other Ambulatory Visit: Payer: Self-pay

## 2020-09-25 DIAGNOSIS — S51851A Open bite of right forearm, initial encounter: Secondary | ICD-10-CM

## 2020-09-25 DIAGNOSIS — W540XXA Bitten by dog, initial encounter: Secondary | ICD-10-CM | POA: Diagnosis not present

## 2020-09-25 DIAGNOSIS — Y93K1 Activity, walking an animal: Secondary | ICD-10-CM | POA: Diagnosis not present

## 2020-09-25 DIAGNOSIS — Y9289 Other specified places as the place of occurrence of the external cause: Secondary | ICD-10-CM | POA: Diagnosis not present

## 2020-09-25 DIAGNOSIS — S51811A Laceration without foreign body of right forearm, initial encounter: Secondary | ICD-10-CM

## 2020-09-25 DIAGNOSIS — S51809A Unspecified open wound of unspecified forearm, initial encounter: Secondary | ICD-10-CM

## 2020-09-25 DIAGNOSIS — Y999 Unspecified external cause status: Secondary | ICD-10-CM | POA: Diagnosis not present

## 2020-09-25 MED ORDER — AMOXICILLIN-POT CLAVULANATE 875-125 MG PO TABS: 1.0000 | ORAL_TABLET | Freq: Two times a day (BID) | ORAL | 0 refills | Status: AC

## 2020-09-25 NOTE — Discharge Instructions (Addendum)
Please call and schedule an appointment with plastic surgery. If you are unable to get an appointment within the next few days, please see primary care for a wound check.  Leave the dressing in place for 24 hours then change it tomorrow evening. Keep the area covered. Use a non-stick dressing over the open area at all times.  Return to the ER for symptoms of concern if unable to see primary care or plastic surgery.  Take the antibiotic as prescribed and until finished.

## 2020-09-25 NOTE — ED Notes (Signed)
Follow up pcp all questions answered

## 2020-09-25 NOTE — ED Provider Notes (Signed)
[facility] Emergency Department Provider Note  ____________________________________________    Time seen: Approximately 7:19 PM  I have reviewed the triage vital signs and the nursing notes.   HISTORY  Chief Complaint Laceration   HPI Shelby Sullivan is a 69 y.o. female presents to the emergency department for evaluation after being bitten by her dog. While walking the dog, she was trying to get some paper away to prevent it from ingesting it when the dog started to growl. She states that she should have known to just let the dog have the paper, but reached down to take it away and it bit her. She has a large skin tear/laceration to the right forearm. Dog is current on vaccinations.   Past Medical History:  Diagnosis Date  . Allergy   . Benign tumor of breast 1996  . GERD (gastroesophageal reflux disease)   . Hyperlipidemia   . ITP (idiopathic thrombocytopenic purpura) 1997    Patient Active Problem List   Diagnosis Date Noted  . Arm laceration, right, subsequent encounter 09/27/2020  . SOB (shortness of breath) 07/25/2020  . Thrombocytopenia ([place]) 01/16/2017  . Advance directive discussed with patient 01/15/2017  . Routine general medical examination at a health care facility 08/13/2012  . Hyperlipemia 06/08/2007  . Allergic rhinitis due to pollen 06/08/2007  . GERD 06/08/2007    Past Surgical History:  Procedure Laterality Date  . WRIST FRACTURE SURGERY  06/2010   right wrist    Prior to Admission medications   Medication Sig Start Date End Date Taking? Authorizing Provider  amoxicillin-clavulanate (AUGMENTIN) 875-125 MG tablet Take 1 tablet by mouth 2 (two) times daily for 10 days. 09/25/20 10/05/20  Tylin Force, Dessa Phi, FNP  fluticasone (FLONASE) 50 MCG/ACT nasal spray Place 2 sprays into both nostrils daily. 11/03/16   Versie Starks, PA-C  Multiple Vitamin (MULTIVITAMIN) capsule Take 1 capsule by mouth daily.    [provider]     Allergies Sulfa antibiotics  Family History  Problem Relation Age of Onset  . Hypertension Father   . Heart disease Paternal Grandfather   . Diabetes Paternal Grandfather   . Thyroid nodules Mother   . Hyperparathyroidism Sister   . Cancer Neg Hx        breast cancer  . Colon cancer Neg Hx   . Esophageal cancer Neg Hx   . Rectal cancer Neg Hx   . Stomach cancer Neg Hx     Social History Social History   Tobacco Use  . Smoking status: Never Smoker  . Smokeless tobacco: Never Used  Substance Use Topics  . Alcohol use: Yes    Alcohol/week: 7.0 standard drinks    Types: 7 Glasses of wine per week  . Drug use: No    Review of Systems  Constitutional: Negative for fever. Respiratory: Negative for cough or shortness of breath.  Musculoskeletal: Negative for myalgias Skin: Positive for skin avulsion Neurological: Negative for numbness or paresthesias. ____________________________________________   PHYSICAL EXAM:  VITAL SIGNS: ED Triage Vitals [09/25/20 1710]  Enc Vitals Group     BP (!) 167/97     Pulse Rate 80     Resp 18     Temp 98 F (36.7 C)     Temp src      SpO2 100 %     Weight 121 lb (54.9 kg)     Height 5' 5.5" (1.664 m)     Head Circumference      Peak Flow  Pain Score 7     Pain Loc      Pain Edu?      Excl. in [place]?      Constitutional: Well appearing. Eyes: Conjunctivae are clear without discharge or drainage. Nose: No rhinorrhea noted. Mouth/Throat: Airway is patent.  Neck: No stridor. Unrestricted Lehr of motion observed. Cardiovascular: Capillary refill is <3 seconds. Arterial bleed after bandage over right forearm removed. Respiratory: Respirations are even and unlabored.. Musculoskeletal: Unrestricted Hutsell of motion observed. Neurologic: Awake, alert, and oriented x 4.  Skin:  Skin tear/laceration to the right forearm.      ____________________________________________   LABS (all labs ordered are listed, but only  abnormal results are displayed)  Labs Reviewed - No data to display ____________________________________________  EKG  Not indicated. ____________________________________________  RADIOLOGY  Not indicated ____________________________________________   PROCEDURES  .Marland KitchenLaceration Repair  Date/Time: 09/30/2020 12:22 AM Performed by: Victorino Dike, FNP Authorized by: Victorino Dike, FNP   Consent:    Consent obtained:  Verbal   Consent given by:  Patient   Risks discussed:  Infection, pain, retained foreign body, poor cosmetic result and poor wound healing Anesthesia (see MAR for exact dosages):    Anesthesia method:  Local infiltration   Local anesthetic:  Lidocaine 2% WITH epi Laceration details:    Location:  Shoulder/arm   Shoulder/arm location:  R lower arm   Length (cm):  16 Repair type:    Repair type:  Complex Pre-procedure details:    Preparation:  Patient was prepped and draped in usual sterile fashion Exploration:    Hemostasis achieved with:  Direct pressure and tied off vessels   Wound exploration: entire depth of wound probed and visualized     Contaminated: no   Treatment:    Area cleansed with:  Saline   Amount of cleaning:  Extensive   Irrigation solution:  Sterile saline   Visualized foreign bodies/material removed: no   Subcutaneous repair:    Suture size:  5-0   Suture material:  Vicryl   Number of sutures:  2 Skin repair:    Repair method:  Sutures   Suture size:  4-0   Suture technique:  Simple interrupted   Number of sutures:  14 Approximation:    Approximation:  Close Post-procedure details:    Dressing:  Sterile dressing, non-adherent dressing and bulky dressing   Patient tolerance of procedure:  Tolerated well, no immediate complications   ____________________________________________   INITIAL IMPRESSION / ASSESSMENT AND PLAN / ED COURSE  Shelby Sullivan is a 69 y.o. female presents after being bitten by her dog.  See HPI for  details.  Wound was irrigated extensively. Small arterial bleed controlled with single figure 8 with vicryl. Skin reapproximated as closely as possible however there is a large skin avulsion. See photos. Area covered with xeroform and bulky dressing applied. Case discussed with ED attending. Will have her follow up with Plastic Surgery. Wound care discussed. Tdap is current. She will be placed on antibiotics and importance of compliance discussed. She is to return to the ER or see PCP for symptoms of concern if unable to see the specialist.   Medications - No data to display   Pertinent labs & imaging results that were available during my care of the patient were reviewed by me and considered in my medical decision making (see chart for details).  ____________________________________________   FINAL CLINICAL IMPRESSION(S) / ED DIAGNOSES  Final diagnoses:  Avulsion of skin of forearm, initial  encounter  Laceration of right forearm, initial encounter  Dog bite of forearm, right, initial encounter    ED Discharge Orders         Ordered    amoxicillin-clavulanate (AUGMENTIN) 875-125 MG tablet  2 times daily        09/25/20 2129           Note:  This document was prepared using Dragon voice recognition software and may include unintentional dictation errors.   Victorino Dike, FNP 09/30/20 Lupita Shutter    Duffy Bruce, MD 10/01/20 [phone]

## 2020-09-25 NOTE — ED Triage Notes (Signed)
Pt comes via POV from home with c/o bite to right forearm. Pt states she was trying to get a piece of paper from her dog and he tore the skin off her arm.   Pt has large skin tear with tissue showing. Arm bandaged at this time and bleeding controlled.

## 2020-09-27 ENCOUNTER — Other Ambulatory Visit: Payer: Self-pay

## 2020-09-27 ENCOUNTER — Ambulatory Visit (INDEPENDENT_AMBULATORY_CARE_PROVIDER_SITE_OTHER): Payer: Medicare HMO | Admitting: Internal Medicine

## 2020-09-27 ENCOUNTER — Encounter: Payer: Self-pay | Admitting: Internal Medicine

## 2020-09-27 DIAGNOSIS — S41111D Laceration without foreign body of right upper arm, subsequent encounter: Secondary | ICD-10-CM | POA: Insufficient documentation

## 2020-09-27 NOTE — Progress Notes (Signed)
Subjective:    Patient ID: Shelby Sullivan, female    DOB: 11/25/1950, 69 y.o.   MRN: [id number]  HPI Here for ER follow up after dog bite This visit occurred during the SARS-CoV-2 public health emergency.  Safety protocols were in place, including screening questions prior to the visit, additional usage of staff PPE, and extensive cleaning of exam room while observing appropriate contact time as indicated for disinfecting solutions.   Was walking dog yesterday Has eaten things and had bowel obstructions--so she was trying to get something out of his mouth He bit her on the right arm Skin peeled back along right forearm Went to [place] urgent care-but nurse brought her to ER Skin sutured and started on augmentin Told her to set up with plastic surgery  Current Outpatient Medications on File Prior to Visit  Medication Sig Dispense Refill  . amoxicillin-clavulanate (AUGMENTIN) 875-125 MG tablet Take 1 tablet by mouth 2 (two) times daily for 10 days. 20 tablet 0  . fluticasone (FLONASE) 50 MCG/ACT nasal spray Place 2 sprays into both nostrils daily. 16 g 6  . Multiple Vitamin (MULTIVITAMIN) capsule Take 1 capsule by mouth daily.     No current facility-administered medications on file prior to visit.    Allergies  Allergen Reactions  . Sulfa Antibiotics     Headaches, malacia, nausea    Past Medical History:  Diagnosis Date  . Allergy   . Benign tumor of breast 1996  . GERD (gastroesophageal reflux disease)   . Hyperlipidemia   . ITP (idiopathic thrombocytopenic purpura) 1997    Past Surgical History:  Procedure Laterality Date  . WRIST FRACTURE SURGERY  06/2010   right wrist    Family History  Problem Relation Age of Onset  . Hypertension Father   . Heart disease Paternal Grandfather   . Diabetes Paternal Grandfather   . Thyroid nodules Mother   . Hyperparathyroidism Sister   . Cancer Neg Hx        breast cancer  . Colon cancer Neg Hx   . Esophageal cancer Neg  Hx   . Rectal cancer Neg Hx   . Stomach cancer Neg Hx     Social History   Socioeconomic History  . Marital status: Married    Spouse name: Not on file  . Number of children: 0  . Years of education: Not on file  . Highest education level: Not on file  Occupational History  . 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 Smoking status: Never Smoker  . Smokeless tobacco: Never Used  Substance and Sexual Activity  . Alcohol use: Yes    Alcohol/week: 7.0 standard drinks    Types: 7 Glasses of wine per week  . Drug use: No  . Sexual activity: Not on file  Other Topics Concern  . Not on file  Social History Narrative   No living will   Would want husband as POA   Would accept resuscitation attempts   Would not want prolonged tube feeds if cognitively unaware   Social Determinants of Health   Financial Resource Strain:   . Difficulty of Paying Living Expenses: Not on file  Food Insecurity:   . Worried About Charity fundraiser in the Last Year: Not on file  . Ran Out of Food in the Last Year: Not on file  Transportation Needs:   . Lack of Transportation (Medical): Not on file  .  Lack of Transportation (Non-Medical): Not on file  Physical Activity:   . Days of Exercise per Week: Not on file  . Minutes of Exercise per Session: Not on file  Stress:   . Feeling of Stress : Not on file  Social Connections:   . Frequency of Communication with Friends and Family: Not on file  . Frequency of Social Gatherings with Friends and Family: Not on file  . Attends Religious Services: Not on file  . Active Member of Clubs or Organizations: Not on file  . Attends Archivist Meetings: Not on file  . Marital Status: Not on file  Intimate Partner Violence:   . Fear of Current or Ex-Partner: Not on file  . Emotionally Abused: Not on file  . Physically Abused: Not on file  . Sexually Abused: Not on file   Review of Systems  No  fever No sig pain     Objective:   Physical Exam Skin:    Comments: Semicircular injury with closed circle more proximal --- ~ 7cm across Ulcerated area towards thumb side with skin loss--- ~3cm circular superficial open clean area Sutures around the rest ---all clean and dry            Assessment & Plan:

## 2020-09-27 NOTE — Assessment & Plan Note (Signed)
Dog bite Significant repaired area that looks okay---and open area with superficial skin avulsion Redressed with bacitracin, non adherent dressing and coban She will redo this daily  I don't think she will need plastic surgery---will see if area granulates acceptably Continue the augmentin Recheck 1 week for suture removal

## 2020-10-05 ENCOUNTER — Encounter: Payer: Self-pay | Admitting: Internal Medicine

## 2020-10-05 ENCOUNTER — Ambulatory Visit (INDEPENDENT_AMBULATORY_CARE_PROVIDER_SITE_OTHER): Payer: Medicare HMO | Admitting: Internal Medicine

## 2020-10-05 ENCOUNTER — Other Ambulatory Visit: Payer: Self-pay

## 2020-10-05 VITALS — BP 122/76 | HR 66 | Temp 97.2°F | Ht 65.0 in | Wt 122.0 lb

## 2020-10-05 DIAGNOSIS — L98499 Non-pressure chronic ulcer of skin of other sites with unspecified severity: Secondary | ICD-10-CM | POA: Insufficient documentation

## 2020-10-05 DIAGNOSIS — S41111D Laceration without foreign body of right upper arm, subsequent encounter: Secondary | ICD-10-CM | POA: Diagnosis not present

## 2020-10-05 DIAGNOSIS — L98492 Non-pressure chronic ulcer of skin of other sites with fat layer exposed: Secondary | ICD-10-CM | POA: Diagnosis not present

## 2020-10-05 MED ORDER — SILVER SULFADIAZINE 1 % EX CREA: 1.0000 "application " | TOPICAL_CREAM | Freq: Every day | CUTANEOUS | 0 refills | Status: DC

## 2020-10-05 NOTE — Progress Notes (Signed)
Subjective:    Patient ID: Shelby Sullivan, female    DOB: Oct 05, 1951, 69 y.o.   MRN: [id number]  HPI Here for reevaluation of dog bite and suture removal This visit occurred during the SARS-CoV-2 public health emergency.  Safety protocols were in place, including screening questions prior to the visit, additional usage of staff PPE, and extensive cleaning of exam room while observing appropriate contact time as indicated for disinfecting solutions.   Husband is changing her dressings--she hasn't looked at it [facility] feeling but no pain Some aching after changing the dressing  Has 2 augmentin left Antibiotic ointment daily  Current Outpatient Medications on File Prior to Visit  Medication Sig Dispense Refill  . amoxicillin-clavulanate (AUGMENTIN) 875-125 MG tablet Take 1 tablet by mouth 2 (two) times daily for 10 days. 20 tablet 0  . fluticasone (FLONASE) 50 MCG/ACT nasal spray Place 2 sprays into both nostrils daily. 16 g 6  . Multiple Vitamin (MULTIVITAMIN) capsule Take 1 capsule by mouth daily.     No current facility-administered medications on file prior to visit.    Allergies  Allergen Reactions  . Sulfa Antibiotics     Headaches, malacia, nausea    Past Medical History:  Diagnosis Date  . Allergy   . Benign tumor of breast 1996  . GERD (gastroesophageal reflux disease)   . Hyperlipidemia   . ITP (idiopathic thrombocytopenic purpura) 1997    Past Surgical History:  Procedure Laterality Date  . WRIST FRACTURE SURGERY  06/2010   right wrist    Family History  Problem Relation Age of Onset  . Hypertension Father   . Heart disease Paternal Grandfather   . Diabetes Paternal Grandfather   . Thyroid nodules Mother   . Hyperparathyroidism Sister   . Cancer Neg Hx        breast cancer  . Colon cancer Neg Hx   . Esophageal cancer Neg Hx   . Rectal cancer Neg Hx   . Stomach cancer Neg Hx     Social History   Socioeconomic History  . Marital status: Married     Spouse name: Not on file  . Number of children: 0  . Years of education: Not on file  . Highest education level: Not on file  Occupational History  . 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 Smoking status: Never Smoker  . Smokeless tobacco: Never Used  Substance and Sexual Activity  . Alcohol use: Yes    Alcohol/week: 7.0 standard drinks    Types: 7 Glasses of wine per week  . Drug use: No  . Sexual activity: Not on file  Other Topics Concern  . Not on file  Social History Narrative   No living will   Would want husband as POA   Would accept resuscitation attempts   Would not want prolonged tube feeds if cognitively unaware   Social Determinants of Health   Financial Resource Strain:   . Difficulty of Paying Living Expenses: Not on file  Food Insecurity:   . Worried About Charity fundraiser in the Last Year: Not on file  . Ran Out of Food in the Last Year: Not on file  Transportation Needs:   . Lack of Transportation (Medical): Not on file  . Lack of Transportation (Non-Medical): Not on file  Physical Activity:   . Days of Exercise per Week: Not on file  . Minutes of Exercise per  Session: Not on file  Stress:   . Feeling of Stress : Not on file  Social Connections:   . Frequency of Communication with Friends and Family: Not on file  . Frequency of Social Gatherings with Friends and Family: Not on file  . Attends Religious Services: Not on file  . Active Member of Clubs or Organizations: Not on file  . Attends Archivist Meetings: Not on file  . Marital Status: Not on file  Intimate Partner Violence:   . Fear of Current or Ex-Partner: Not on file  . Emotionally Abused: Not on file  . Physically Abused: Not on file  . Sexually Abused: Not on file   Review of Systems  No fever No problems with the dog     Objective:   Physical Exam Skin:    Comments: Ulcer on forearm doesn't have any  granulation Laceration area is non inflamed            Assessment & Plan:

## 2020-10-05 NOTE — Assessment & Plan Note (Signed)
No granulation seen May indicate full thickness skin loss Will set up with plastic surgery to see what options there are for this Will change to silvadene as not sulfa allergic on review and will give better cushioning

## 2020-10-05 NOTE — Assessment & Plan Note (Signed)
14 sutures removed without difficulty Wound does pull apart still----steristrips applied with benzoin

## 2020-10-14 NOTE — Progress Notes (Signed)
New Patient Office Visit  Subjective:  Patient ID: Shelby Sullivan, female    DOB: [birth year]  Age: 69 y.o. MRN: [id number]  CC:  Chief Complaint  Patient presents with  . Advice Only    HPI Shelby Sullivan presents for consultation for a large skin tear/laceration to the right forearm after being bitten by her dog.    Patient presented to the emergency room on 09/25/2020 for evaluation after being bitten by her dog.  She was walking the dog and was trying to get some paper away to prevent it from ingesting it when the dog started to [place] and subsequently bit her.  Dog is current on vaccinations.  In the ER wound was irrigated extensively and small arterial bleed controlled with single figure-of-eight Vicryl suture.  Skin was reapproximated as closely as possible however there was a large skin avulsion.  Area was covered with Xeroform and a bulky dressing.  Tdap is current.  She was placed on antibiotics.  ~ 3 weeks post injury Patient reports she is doing well overall. Denies fever, chills, nausea, vomiting. Reports mild pain of wound where the skin is missing. Mild drainage. She has been keeping it covered with antibiotic ointment, nonstick pad, and wrapping with Coban over the open wound area.  Reports sutures were removed by her primary care provider and Steri-Strips were placed.   Past Medical History:  Diagnosis Date  . Allergy   . Benign tumor of breast 1996  . GERD (gastroesophageal reflux disease)   . Hyperlipidemia   . ITP (idiopathic thrombocytopenic purpura) 1997    Past Surgical History:  Procedure Laterality Date  . WRIST FRACTURE SURGERY  06/2010   right wrist    Family History  Problem Relation Age of Onset  . Hypertension Father   . Heart disease Paternal Grandfather   . Diabetes Paternal Grandfather   . Thyroid nodules Mother   . Hyperparathyroidism Sister   . Cancer Neg Hx        breast cancer  . Colon cancer Neg Hx   . Esophageal cancer Neg Hx   .  Rectal cancer Neg Hx   . Stomach cancer Neg Hx     Social History   Socioeconomic History  . Marital status: Married    Spouse name: Not on file  . Number of children: 0  . Years of education: Not on file  . Highest education level: Not on file  Occupational History  . 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 Smoking status: Never Smoker  . Smokeless tobacco: Never Used  Substance and Sexual Activity  . Alcohol use: Yes    Alcohol/week: 7.0 standard drinks    Types: 7 Glasses of wine per week  . Drug use: No  . Sexual activity: Not on file  Other Topics Concern  . Not on file  Social History Narrative   No living will   Would want husband as POA   Would accept resuscitation attempts   Would not want prolonged tube feeds if cognitively unaware   Social Determinants of Health   Financial Resource Strain:   . Difficulty of Paying Living Expenses: Not on file  Food Insecurity:   . Worried About Charity fundraiser in the Last Year: Not on file  . Ran Out of Food in the Last Year: Not on file  Transportation Needs:   . Lack of Transportation (Medical): Not  on file  . Lack of Transportation (Non-Medical): Not on file  Physical Activity:   . Days of Exercise per Week: Not on file  . Minutes of Exercise per Session: Not on file  Stress:   . Feeling of Stress : Not on file  Social Connections:   . Frequency of Communication with Friends and Family: Not on file  . Frequency of Social Gatherings with Friends and Family: Not on file  . Attends Religious Services: Not on file  . Active Member of Clubs or Organizations: Not on file  . Attends Archivist Meetings: Not on file  . Marital Status: Not on file  Intimate Partner Violence:   . Fear of Current or Ex-Partner: Not on file  . Emotionally Abused: Not on file  . Physically Abused: Not on file  . Sexually Abused: Not on file    ROS Review of Systems   Constitutional: Negative for chills and fever.  Respiratory: Negative for shortness of breath.   Cardiovascular: Negative for chest pain.  Gastrointestinal: Negative for constipation, diarrhea, nausea and vomiting.  Skin: Positive for wound (right forearm). Negative for color change, pallor and rash.    Objective:   Today's Vitals: BP (!) 146/80 (BP Location: Left Arm, Patient Position: Sitting, Cuff Size: Normal)   Pulse 67   Temp 98.2 F (36.8 C) (Oral)   Ht 5' 5.5" (1.664 m)   Wt 119 lb 12.8 oz (54.3 kg)   SpO2 100%   BMI 19.63 kg/m   Physical Exam Constitutional:      General: She is not in acute distress.    Appearance: Normal appearance. She is normal weight. She is not ill-appearing.  HENT:     Head: Normocephalic and atraumatic.  Eyes:     Extraocular Movements: Extraocular movements intact.  Cardiovascular:     Rate and Rhythm: Normal rate.     Pulses: Normal pulses.  Pulmonary:     Effort: Pulmonary effort is normal.  Musculoskeletal:        General: Normal Forrest of motion.     Cervical back: Normal Wassmer of motion.  Skin:    General: Skin is warm and dry.     Coloration: Skin is not pale.     Findings: No erythema or rash.     Comments: Lacerations are intact, clean and dry.  Steri-Strips in place.  Area of skin vulsion shows improvement from ER visit.  No signs of infection.  Mild drainage present.  She has some improved epithelialization around the edges.  Current dimensions 2 x 3.5 cm.  Neurological:     General: No focal deficit present.     Mental Status: She is alert and oriented to person, place, and time.  Psychiatric:        Mood and Affect: Mood normal.        Behavior: Behavior normal.        Thought Content: Thought content normal.        Judgment: Judgment normal.       Assessment & Plan:   Patient's wound shows nice improvement from her ER visit.  Lacerations are intact, clean and dry.  With no signs of infection.  We discussed options  for the area of skin avulsion would be either a skin graft or to allow it to heal on its own with good wound care. Pros and cons were discussed to patient's satisfaction  Patient would like to try to let it heal on its own with  good wound care.  I feel this is reasonable.  Follow-up in 2 to 3 weeks for wound check.  Return precautions provided.  Call office with any questions/concerns.  Every 3 days patient can shower allowing soap and water to run over the wound (she should avoid getting it wet on other days by covering with a bag when she showers). She will then will place endoform moistened lightly with saline over open wound and cover with Adaptic.  Daily she will apply K-Y jelly to the Adaptic, cover with gauze, and wrap with Kerlix and Ace wrap.  Recommend continuing to focus on good nutrition to optimize wound healing including eating lean protein, lots of vegetables and taking a daily multivitamin.  Threasa Heads, PA-C

## 2020-10-18 ENCOUNTER — Other Ambulatory Visit: Payer: Self-pay

## 2020-10-18 ENCOUNTER — Ambulatory Visit: Payer: Medicare HMO | Admitting: Plastic Surgery

## 2020-10-18 ENCOUNTER — Encounter: Payer: Self-pay | Admitting: Plastic Surgery

## 2020-10-18 VITALS — BP 146/80 | HR 67 | Temp 98.2°F | Ht 65.5 in | Wt 119.8 lb

## 2020-10-18 DIAGNOSIS — T148XXA Other injury of unspecified body region, initial encounter: Secondary | ICD-10-CM

## 2020-10-18 DIAGNOSIS — S41151D Open bite of right upper arm, subsequent encounter: Secondary | ICD-10-CM

## 2020-10-18 DIAGNOSIS — S51801D Unspecified open wound of right forearm, subsequent encounter: Secondary | ICD-10-CM | POA: Insufficient documentation

## 2020-10-18 DIAGNOSIS — S41111D Laceration without foreign body of right upper arm, subsequent encounter: Secondary | ICD-10-CM

## 2020-10-18 DIAGNOSIS — S51801A Unspecified open wound of right forearm, initial encounter: Secondary | ICD-10-CM | POA: Diagnosis not present

## 2020-10-19 ENCOUNTER — Telehealth: Payer: Self-pay | Admitting: *Deleted

## 2020-10-19 NOTE — Telephone Encounter (Signed)
Faxed order to [facility] for medical supplies for the patient.  Confirmation received and copy scanned into the chart.//AB/CMA  Supplies:Roll Gauze-2"                Gauze                Adaptic                Endoform                Ace Wrap-2"                Saline

## 2020-10-22 NOTE — Telephone Encounter (Signed)
Received on (10/18/20) Order Status Notification from [facility].  Stating:The order has been shipped to the patient today.  [facility] verified the patient's coverage criteria and provided service to your patient with products that maximize the patient's benefits and limit out of pocket costs.//AB/CMA

## 2020-11-05 NOTE — Progress Notes (Deleted)
Patient is a 69 yr-old female here for follow up for her right forearm tear/laceration. She was evaluated and treated on 09/25/20 in the ER. Wound was extensively irrigated and a small arterial bleed was controlled with a single figure-of-eight Vicryl suture. Remaining skin was re-approximated as closely as possible. At last visit on 10/18/20, laceration was healing nicely. Open wound showed improved epithelization around the edges leaving a 2 x 3.5 cm wound.  Current dressing changes: Every 3 days patient can shower allowing soap and water to run over the wound (avoid getting it wet on other days). Then place endoform moistened lightly with saline over open wound and cover with Adaptic. Daily she will apply K-Y Jelly to the Adaptic, cover with gauze, and wrap Kerlix/ACE wrap.

## 2020-11-07 ENCOUNTER — Encounter: Payer: Self-pay | Admitting: Plastic Surgery

## 2020-11-07 ENCOUNTER — Ambulatory Visit: Payer: Medicare HMO | Admitting: Plastic Surgery

## 2020-11-07 ENCOUNTER — Other Ambulatory Visit: Payer: Self-pay

## 2020-11-07 VITALS — BP 131/80 | HR 61 | Temp 98.1°F

## 2020-11-07 DIAGNOSIS — T148XXA Other injury of unspecified body region, initial encounter: Secondary | ICD-10-CM

## 2020-11-07 DIAGNOSIS — S51801D Unspecified open wound of right forearm, subsequent encounter: Secondary | ICD-10-CM | POA: Diagnosis not present

## 2020-11-07 NOTE — Progress Notes (Signed)
Patient is a 69 year old female here for follow-up on her skin tear/laceration to the right forearm after being bitten by dog. She presented to the emergency room on 09/25/2020 where they irrigated the wound, controlled a small arterial bleed, and reapproximated the skin as closely as possible. The cover the area without skin remaining with Xeroform and a bulky dressing. Dog was current on vaccinations and patient is current with Tdap. She was also placed on antibiotics. At her last visit with [place] on 10/18/2020 the wound was healing nicely and beginning to show signs of epithelialization along the edges. Wound was 2 x 3.5 cm. Patient elected to allow the wound to heal on its own instead of going for a skin graft.  ~ 3 weeks follow up today.  Patient reports she is doing well today and feels like her wound has improved significantly.  She is very happy!  Denies fever/chills, nausea/vomiting, pain at wound site.  Wound shows very nice improvement with good epithelialization.  No signs of infection, redness, drainage, seroma/hematoma.   Continue with current dressing changes until she runs out of Endoform. Then switch to daily dressing changes of vaseline and gauze until well healed. Follow up in 2-3 weeks. Return precautions provided. Call office with any questions/concerns.   Pictures were obtained of the patient and placed in the chart with the patient's or guardian's permission.

## 2020-11-17 HISTORY — PX: OTHER SURGICAL HISTORY: SHX169

## 2020-11-25 NOTE — Progress Notes (Signed)
Patient is a 70 year old female here for follow-up on her skin tear/laceration to the right forearm after being bitten by her dog. She presented to the emergency room on 09/25/2020 with a irrigated the wound, controlled a small arterial bleed, and reapproximated the remaining skin as closely as possible. Dog was current on vaccinations and patient is current on Tdap. Patient elected to allow the wound to heal on its own instead of going for a skin graft. At last visit on 12/22, wound showed excellent improvement with good epithelialization. She did dressing changes consisting of endoform for few weeks and then switched to Vaseline and gauze.  ~ 9 week follow up Patient reports she is doing very well.  She is extremely happy with how well she is healed.  She denies fever/chills, nausea/vomiting, pain at wound site.  Wound is healed very nicely with good layer of epithelialization throughout. No signs of infection or drainage. She would like to continue doing daily dressing changes for 1 more week to allow additional epithelization to occur before leaving exposed. I think this is reasonable. After that she does not have to wrap it anymore and my use Vitamin E or scar cream of her choice. Follow up as needed. Return precautions given. Call office with any questions/concerns.

## 2020-11-26 ENCOUNTER — Encounter: Payer: Self-pay | Admitting: Plastic Surgery

## 2020-11-26 ENCOUNTER — Ambulatory Visit (INDEPENDENT_AMBULATORY_CARE_PROVIDER_SITE_OTHER): Payer: Medicare HMO | Admitting: Plastic Surgery

## 2020-11-26 ENCOUNTER — Other Ambulatory Visit: Payer: Self-pay

## 2020-11-26 VITALS — BP 125/71 | HR 70

## 2020-11-26 DIAGNOSIS — T148XXA Other injury of unspecified body region, initial encounter: Secondary | ICD-10-CM | POA: Diagnosis not present

## 2020-11-26 DIAGNOSIS — S51801D Unspecified open wound of right forearm, subsequent encounter: Secondary | ICD-10-CM | POA: Diagnosis not present

## 2021-01-21 ENCOUNTER — Encounter: Payer: Self-pay | Admitting: Family Medicine

## 2021-01-21 ENCOUNTER — Other Ambulatory Visit: Payer: Self-pay

## 2021-01-21 ENCOUNTER — Ambulatory Visit (INDEPENDENT_AMBULATORY_CARE_PROVIDER_SITE_OTHER): Payer: Medicare HMO | Admitting: Family Medicine

## 2021-01-21 VITALS — BP 130/80 | HR 71 | Temp 98.8°F | Ht 65.0 in | Wt 119.5 lb

## 2021-01-21 DIAGNOSIS — R002 Palpitations: Secondary | ICD-10-CM | POA: Diagnosis not present

## 2021-01-21 DIAGNOSIS — R519 Headache, unspecified: Secondary | ICD-10-CM

## 2021-01-21 DIAGNOSIS — R9431 Abnormal electrocardiogram [ECG] [EKG]: Secondary | ICD-10-CM

## 2021-01-21 DIAGNOSIS — Z9842 Cataract extraction status, left eye: Secondary | ICD-10-CM | POA: Diagnosis not present

## 2021-01-21 DIAGNOSIS — R2689 Other abnormalities of gait and mobility: Secondary | ICD-10-CM | POA: Diagnosis not present

## 2021-01-21 DIAGNOSIS — I517 Cardiomegaly: Secondary | ICD-10-CM

## 2021-01-21 DIAGNOSIS — R6884 Jaw pain: Secondary | ICD-10-CM

## 2021-01-21 DIAGNOSIS — R4701 Aphasia: Secondary | ICD-10-CM

## 2021-01-21 DIAGNOSIS — H9319 Tinnitus, unspecified ear: Secondary | ICD-10-CM

## 2021-01-21 LAB — TSH: TSH: 0.4 u[IU]/mL (ref 0.35–4.50)

## 2021-01-21 LAB — CBC WITH DIFFERENTIAL/PLATELET
Basophils Absolute: 0 10*3/uL (ref 0.0–0.1)
Basophils Relative: 0.5 % (ref 0.0–3.0)
Eosinophils Absolute: 0 10*3/uL (ref 0.0–0.7)
Eosinophils Relative: 0.2 % (ref 0.0–5.0)
HCT: 40.6 % (ref 36.0–46.0)
Hemoglobin: 13.7 g/dL (ref 12.0–15.0)
Lymphocytes Relative: 16.3 % (ref 12.0–46.0)
Lymphs Abs: 1.1 10*3/uL (ref 0.7–4.0)
MCHC: 33.7 g/dL (ref 30.0–36.0)
MCV: 91 fl (ref 78.0–100.0)
Monocytes Absolute: 0.4 10*3/uL (ref 0.1–1.0)
Monocytes Relative: 5.4 % (ref 3.0–12.0)
Neutro Abs: 5.4 10*3/uL (ref 1.4–7.7)
Neutrophils Relative %: 77.6 % — ABNORMAL HIGH (ref 43.0–77.0)
Platelets: 117 10*3/uL — ABNORMAL LOW (ref 150.0–400.0)
RBC: 4.46 Mil/uL (ref 3.87–5.11)
RDW: 12.8 % (ref 11.5–15.5)
WBC: 7 10*3/uL (ref 4.0–10.5)

## 2021-01-21 LAB — HEPATIC FUNCTION PANEL
ALT: 20 U/L (ref 0–35)
AST: 23 U/L (ref 0–37)
Albumin: 4.6 g/dL (ref 3.5–5.2)
Alkaline Phosphatase: 42 U/L (ref 39–117)
Bilirubin, Direct: 0.1 mg/dL (ref 0.0–0.3)
Total Bilirubin: 0.6 mg/dL (ref 0.2–1.2)
Total Protein: 6.6 g/dL (ref 6.0–8.3)

## 2021-01-21 LAB — HEMOGLOBIN A1C: Hgb A1c MFr Bld: 5.4 % (ref 4.6–6.5)

## 2021-01-21 LAB — BASIC METABOLIC PANEL
BUN: 14 mg/dL (ref 6–23)
CO2: 31 mEq/L (ref 19–32)
Calcium: 9.7 mg/dL (ref 8.4–10.5)
Chloride: 103 mEq/L (ref 96–112)
Creatinine, Ser: 0.7 mg/dL (ref 0.40–1.20)
GFR: 86.68 mL/min (ref >60.00)
Glucose, Bld: 88 mg/dL (ref 70–99)
Potassium: 4.1 mEq/L (ref 3.5–5.1)
Sodium: 139 mEq/L (ref 135–145)

## 2021-01-21 LAB — VITAMIN B12: Vitamin B-12: 220 pg/mL (ref 211–911)

## 2021-01-21 LAB — T4, FREE: Free T4: 0.97 ng/dL (ref 0.60–1.60)

## 2021-01-21 LAB — T3, FREE: T3, Free: 3.5 pg/mL (ref 2.3–4.2)

## 2021-01-21 NOTE — Progress Notes (Signed)
Shelby T. Copland, MD, [place] at [facility] [place], [address]  Phone: [phone]  FAX: [place] - 70 y.o. female  MRN [id number]  Date of Birth: 1951-06-19  Date: 01/21/2021  PCP: Venia Carbon, MD  Referral: Venia Carbon, MD  Chief Complaint  Patient presents with  . Problems since Cataract Surgery 12/31/2020  . Abdominal Pain  . Headache  . Jaw Pain    This visit occurred during the SARS-CoV-2 public health emergency.  Safety protocols were in place, including screening questions prior to the visit, additional usage of staff PPE, and extensive cleaning of exam room while observing appropriate contact time as indicated for disinfecting solutions.   Subjective:   Shelby Sullivan is a 70 y.o. very pleasant female patient with Body mass index is 19.89 kg/m. who presents with the following:  The patient presents with an onset of multiple symptoms, question if possible from post surgical intervention.  She had cataract surgery at [facility] on December 31, 2020.  This appears to be done on the left eye on chart review.  Had been groggy after she had her cataract surgery. Foggy and had some aphasia.  Trouble walking and felt like she was confused some. Had a headache.  Heart racing.  Sporadic. Off and on - Few sec maybe.  Worsening headaches all around this time.  3 days, pain in her neck and jaw sometimes.  Normally will never have a headache.   She felt her ears ringing and felt like she was passing out and called the EMT. Ears ringing, felt like going to pass out for several days.   1st Versed only at her cataract surgery  2nd Then zofran Then fentanyl  Played tennis last week and she was out of breath This morning a little out of breath.  Ataxia - mild  Review of Systems is noted in the HPI, as appropriate  Patient Active Problem List    Diagnosis Date Noted  . Open wound of lower arm, right, subsequent encounter 10/18/2020  . Arm ulcer ([place]) 10/05/2020  . Arm laceration, right, subsequent encounter 09/27/2020  . SOB (shortness of breath) 07/25/2020  . Thrombocytopenia ([place]) 01/16/2017  . Advance directive discussed with patient 01/15/2017  . Routine general medical examination at a health care facility 08/13/2012  . Hyperlipemia 06/08/2007  . Allergic rhinitis due to pollen 06/08/2007  . GERD 06/08/2007    Past Medical History:  Diagnosis Date  . Allergy   . Benign tumor of breast 1996  . GERD (gastroesophageal reflux disease)   . Hyperlipidemia   . ITP (idiopathic thrombocytopenic purpura) 1997    Past Surgical History:  Procedure Laterality Date  . WRIST FRACTURE SURGERY  06/2010   right wrist    Family History  Problem Relation Age of Onset  . Hypertension Father   . Heart disease Paternal Grandfather   . Diabetes Paternal Grandfather   . Thyroid nodules Mother   . Hyperparathyroidism Sister   . Cancer Neg Hx        breast cancer  . Colon cancer Neg Hx   . Esophageal cancer Neg Hx   . Rectal cancer Neg Hx   . Stomach cancer Neg Hx      Objective:   BP 130/80   Pulse 71   Temp 98.8 F (37.1 C) (Temporal)   Ht 5\' 5"  (1.651 m)  Wt 119 lb 8 oz (54.2 kg)   SpO2 98%   BMI 19.89 kg/m     GEN: No acute distress; alert,appropriate. PULM: Breathing comfortably in no respiratory distress CV: RRR, no m/g/r  PULM: Normal respiratory rate, no accessory muscle use. No wheezes, crackles or rhonchi  Neuro: CN 2-12 grossly intact. PERRLA. EOMI. Sensation intact throughout. Str 5/5 all extremities. DTR 2+. No clonus. A and o x 4. Romberg neg. Finger nose neg. Heel -shin off balance mildly and heel toe walking somewhat unstable.  PSYCH: Normally interactive. Conversant. Not depressed or anxious appearing.  Calm demeanor.     Laboratory and Imaging Data: Results for orders placed or performed in  visit on 07/25/20  Comprehensive metabolic panel  Result Value Ref Hetz   Sodium 139 135 - 145 mEq/L   Potassium 4.4 3.5 - 5.1 mEq/L   Chloride 103 96 - 112 mEq/L   CO2 31 19 - 32 mEq/L   Glucose, Bld 86 70 - 99 mg/dL   BUN 14 6 - 23 mg/dL   Creatinine, Ser 0.67 0.40 - 1.20 mg/dL   Total Bilirubin 0.7 0.2 - 1.2 mg/dL   Alkaline Phosphatase 46 39 - 117 U/L   AST 24 0 - 37 U/L   ALT 22 0 - 35 U/L   Total Protein 6.6 6.0 - 8.3 g/dL   Albumin 4.7 3.5 - 5.2 g/dL   GFR 86.65 >60.00 mL/min   Calcium 9.5 8.4 - 10.5 mg/dL  VITAMIN D 25 Hydroxy (Vit-D Deficiency, Fractures)  Result Value Ref Visscher   VITD 35.24 30.00 - 100.00 ng/mL  CBC  Result Value Ref Robey   WBC 5.0 4.0 - 10.5 K/uL   RBC 4.48 3.87 - 5.11 Mil/uL   Platelets 131.0 (L) 150.0 - 400.0 K/uL   Hemoglobin 13.9 12.0 - 15.0 g/dL   HCT 41.2 36.0 - 46.0 %   MCV 92.0 78.0 - 100.0 fl   MCHC 33.8 30.0 - 36.0 g/dL   RDW 13.3 11.5 - 15.5 %  Lipid panel  Result Value Ref Berkel   Cholesterol 217 (H) 0 - 200 mg/dL   Triglycerides 73.0 0.0 - 149.0 mg/dL   HDL 72.10 >39.00 mg/dL   VLDL 14.6 0.0 - 40.0 mg/dL   LDL Cholesterol 131 (H) 0 - 99 mg/dL   Total CHOL/HDL Ratio 3    NonHDL 145.12      Assessment and Plan:     ICD-10-CM   1. Status post cataract surgery, left  Z98.42   2. Palpitations  R00.2 EKG 12-Lead    T4, free    T3, free    TSH    Vitamin B12    ECHOCARDIOGRAM COMPLETE    MR Brain Wo Contrast  3. Acute nonintractable headache, unspecified headache type  D98.3 Basic metabolic panel    CBC with Differential/Platelet    Hepatic function panel    Hemoglobin A1c    T4, free    T3, free    TSH    Vitamin B12    MR Brain Wo Contrast  4. Balance problem  J82.50 Basic metabolic panel    CBC with Differential/Platelet    Hepatic function panel    Hemoglobin A1c    T4, free    T3, free    TSH    Vitamin B12    MR Brain Wo Contrast  5. Expressive aphasia  N39.76 Basic metabolic panel    CBC with  Differential/Platelet    Hepatic function panel  Hemoglobin A1c    T4, free    T3, free    TSH    Vitamin B12    MR Brain Wo Contrast  6. Tinnitus, unspecified laterality  H93.19   7. Jaw pain  R68.84   8. LVH (left ventricular hypertrophy)  I51.7 ECHOCARDIOGRAM COMPLETE  9. Abnormal electrocardiogram (ECG) (EKG)  R94.31 ECHOCARDIOGRAM COMPLETE   Total encounter time: 40 minutes. This includes total time spent on the day of encounter. Complicated case.  Additional time on [facility] chart review.    Multiple symptoms without clear diagnosis, but I think this needs to be considered stroke until proven otherwise.  In a setting of aphasia, imbalance with walking, intermittent confusion, and new onset headache, obtain an MRI of the brain without contrast to evaluate for stroke.  EKG: Normal sinus rhythm. Normal axis, normal R wave progression, No acute ST elevation or depression. There is LVH on EKG.  She was not orthostatic - I do not know how to pull into Epic.  LVH with some mild sob, check echo.  Check all basic labs.   There are no discontinued medications. Orders Placed This Encounter  Procedures  . MR Brain Wo Contrast  . Basic metabolic panel  . CBC with Differential/Platelet  . Hepatic function panel  . Hemoglobin A1c  . T4, free  . T3, free  . TSH  . Vitamin B12  . EKG 12-Lead  . ECHOCARDIOGRAM COMPLETE    Follow-up: No follow-ups on file.  Signed,  Maud Deed. Copland, MD   Outpatient Encounter Medications as of 01/21/2021  Medication Sig  . Multiple Vitamin (MULTIVITAMIN) capsule Take 1 capsule by mouth daily.  . Prednisol Ace-Moxiflox-Bromfen 1-0.5-0.075 % SUSP Apply to eye.  . fluticasone (FLONASE) 50 MCG/ACT nasal spray Place 2 sprays into both nostrils daily. (Patient not taking: Reported on 01/21/2021)   No facility-administered encounter medications on file as of 01/21/2021.

## 2021-02-04 NOTE — Telephone Encounter (Signed)
Can you help these questions for the patient?  Thanks!

## 2021-02-05 ENCOUNTER — Ambulatory Visit
Admission: RE | Admit: 2021-02-05 | Discharge: 2021-02-05 | Disposition: A | Discharge status: Home / Self Care | Payer: Medicare HMO | Source: Ambulatory Visit | Attending: Family Medicine | Admitting: Family Medicine

## 2021-02-05 ENCOUNTER — Other Ambulatory Visit: Payer: Self-pay

## 2021-02-05 DIAGNOSIS — R4701 Aphasia: Secondary | ICD-10-CM | POA: Diagnosis present

## 2021-02-05 DIAGNOSIS — R2689 Other abnormalities of gait and mobility: Secondary | ICD-10-CM

## 2021-02-05 DIAGNOSIS — R002 Palpitations: Secondary | ICD-10-CM | POA: Insufficient documentation

## 2021-02-05 DIAGNOSIS — R519 Headache, unspecified: Secondary | ICD-10-CM | POA: Diagnosis present

## 2021-02-06 ENCOUNTER — Other Ambulatory Visit: Payer: Self-pay | Admitting: Family Medicine

## 2021-02-06 DIAGNOSIS — I517 Cardiomegaly: Secondary | ICD-10-CM

## 2021-02-06 DIAGNOSIS — R9431 Abnormal electrocardiogram [ECG] [EKG]: Secondary | ICD-10-CM

## 2021-02-12 ENCOUNTER — Other Ambulatory Visit: Payer: Self-pay

## 2021-02-12 ENCOUNTER — Ambulatory Visit (INDEPENDENT_AMBULATORY_CARE_PROVIDER_SITE_OTHER): Payer: Medicare HMO

## 2021-02-12 DIAGNOSIS — R9431 Abnormal electrocardiogram [ECG] [EKG]: Secondary | ICD-10-CM | POA: Diagnosis not present

## 2021-02-12 DIAGNOSIS — I517 Cardiomegaly: Secondary | ICD-10-CM

## 2021-02-13 LAB — ECHOCARDIOGRAM COMPLETE
AR max vel: 2.17 cm2
AV Area VTI: 2.09 cm2
AV Area mean vel: 2.16 cm2
AV Mean grad: 4 mmHg
AV Peak grad: 8.3 mmHg
Ao pk vel: 1.44 m/s
Area-P 1/2: 4.12 cm2
Calc EF: 56.2 %
S' Lateral: 2.4 cm
Single Plane A2C EF: 55.3 %
Single Plane A4C EF: 54.5 %

## 2021-03-28 ENCOUNTER — Telehealth: Payer: Self-pay

## 2021-03-28 MED ORDER — NIRMATRELVIR/RITONAVIR (PAXLOVID)TABLET: ORAL_TABLET | ORAL | 0 refills | Status: DC

## 2021-03-28 NOTE — Telephone Encounter (Signed)
Spoke to pt. She is deciding if she wants to take it or not.

## 2021-03-28 NOTE — Telephone Encounter (Signed)
Pt called to say she did a home Covid test this morning and tested positive. Fever started 2 days ago. Head and nasal congestion. Very similar to her allergies, but spiked a fever. Asking if she qualifies for the oral treatment. Uses [facility] if it has to be prescribed. Supposed to be going on vacation Saturday for a week. Please advise at [phone].

## 2021-03-28 NOTE — Telephone Encounter (Signed)
Please let her know that I did send Rx for paxlovid (2 different meds for 5 days). Confirm she knows it is not FDA approved yet--but has emergency use authorization and is generally well tolerated. I sent it to the pharmacy

## 2021-03-28 NOTE — Addendum Note (Signed)
Addended by: Viviana Simpler I on: 03/28/2021 01:06 PM   Modules accepted: Orders

## 2021-06-07 LAB — HM MAMMOGRAPHY

## 2021-07-30 ENCOUNTER — Other Ambulatory Visit: Payer: Self-pay

## 2021-07-30 ENCOUNTER — Ambulatory Visit (INDEPENDENT_AMBULATORY_CARE_PROVIDER_SITE_OTHER): Payer: Medicare HMO | Admitting: Internal Medicine

## 2021-07-30 ENCOUNTER — Encounter: Payer: Self-pay | Admitting: Internal Medicine

## 2021-07-30 VITALS — BP 128/80 | HR 67 | Temp 97.9°F | Ht 65.0 in | Wt 119.0 lb

## 2021-07-30 DIAGNOSIS — D696 Thrombocytopenia, unspecified: Secondary | ICD-10-CM

## 2021-07-30 DIAGNOSIS — K219 Gastro-esophageal reflux disease without esophagitis: Secondary | ICD-10-CM | POA: Diagnosis not present

## 2021-07-30 DIAGNOSIS — Z7189 Other specified counseling: Secondary | ICD-10-CM | POA: Diagnosis not present

## 2021-07-30 DIAGNOSIS — Z0000 Encounter for general adult medical examination without abnormal findings: Secondary | ICD-10-CM

## 2021-07-30 NOTE — Progress Notes (Signed)
Hearing Screening  Method: Audiometry   '500Hz'$  '1000Hz'$  '2000Hz'$  '4000Hz'$   Right ear '20 25 20 20  '$ Left ear 0 0 20 0  Vision Screening - Comments:: March 2023

## 2021-07-30 NOTE — Assessment & Plan Note (Signed)
Stable ~120K No action needed

## 2021-07-30 NOTE — Progress Notes (Signed)
Subjective:    Patient ID: Shelby Sullivan, female    DOB: January 18, 1951, 70 y.o.   MRN: [id number]  HPI Here for Medicare wellness visit and follow up of chronic health conditions This visit occurred during the SARS-CoV-2 public health emergency.  Safety protocols were in place, including screening questions prior to the visit, additional usage of staff PPE, and extensive cleaning of exam room while observing appropriate contact time as indicated for disinfecting solutions.   Reviewed form and advanced directives Reviewed other doctors Still enjoys 1 glass of wine when preparing dinner No tobacco Exercising regularly Vision is fine Trouble with left ear in hearing No falls No depression or anhedonia Independent with instrumental ADLs No sig memory issues  Has had brief sharp pain under sternum in the past month Mostly in AM---not related to eating No N/V, indigestion, heartburn Never with exercise---she feels better then  Never had recurrence of symptoms after cataract surgery  Known low platelets No abnormal bleeding. Easy bruising is not different  Arm ulcer did heal well --didn't need skin graft (just used collagen matrix)  Current Outpatient Medications on File Prior to Visit  Medication Sig Dispense Refill   fluticasone (FLONASE) 50 MCG/ACT nasal spray Place 2 sprays into both nostrils daily. 16 g 6   Multiple Vitamin (MULTIVITAMIN) capsule Take 1 capsule by mouth daily.     No current facility-administered medications on file prior to visit.    Allergies  Allergen Reactions   Sulfa Antibiotics Hives    Headaches, malacia, nausea   Fentanyl Other (See Comments)    After cataract surgery--CNS side effects   Zofran [Ondansetron] Other (See Comments)    After cataract surgery---CNS side effects   Silver Sulfadiazine Rash    Ears ringing, Heart palpations    Past Medical History:  Diagnosis Date   Allergy    Benign tumor of breast 1996   GERD (gastroesophageal  reflux disease)    Hyperlipidemia    ITP (idiopathic thrombocytopenic purpura) 1997    Past Surgical History:  Procedure Laterality Date   WRIST FRACTURE SURGERY  06/2010   right wrist    Family History  Problem Relation Age of Onset   Hypertension Father    Heart disease Paternal Grandfather    Diabetes Paternal Grandfather    Thyroid nodules Mother    Hyperparathyroidism Sister    Cancer Neg Hx        breast cancer   Colon cancer Neg Hx    Esophageal cancer Neg Hx    Rectal cancer Neg Hx    Stomach cancer Neg Hx     Social History   Socioeconomic History   Marital status: Married    Spouse name: Not on file   Number of children: 0   Years of education: Not on file   Highest education level: Not on file  Occupational History   Occupation: Education officer, museum    Employer: [employer]    Comment: Retired  Tobacco Use   Smoking status: Never   Smokeless tobacco: Never  Substance and Sexual Activity   Alcohol use: Yes    Alcohol/week: 7.0 standard drinks    Types: 7 Glasses of wine per week   Drug use: No   Sexual activity: Not on file  Other Topics Concern   Not on file  Social History Narrative   No living will   Would want husband as POA---alternative is god-daughter Hermina Staggers   Would accept resuscitation attempts   Would not  want prolonged tube feeds if cognitively unaware   Social Determinants of Health   Financial Resource Strain: Not on file  Food Insecurity: Not on file  Transportation Needs: Not on file  Physical Activity: Not on file  Stress: Not on file  Social Connections: Not on file  Intimate Partner Violence: Not on file   Review of Systems Appetite is good Weight is stable Sleeps well Wears seat belt Teeth are fine No suspicious skin lesions Bowels are fine--no blood No dysuria or incontinence No chest pain. Easy DOE after COVID--this is back to normal Has felt unusually hot this summer    Objective:   Physical  Exam Constitutional:      Appearance: Normal appearance.  HENT:     Mouth/Throat:     Comments: No lesions Eyes:     Conjunctiva/sclera: Conjunctivae normal.     Pupils: Pupils are equal, round, and reactive to light.  Cardiovascular:     Rate and Rhythm: Normal rate and regular rhythm.     Pulses: Normal pulses.     Heart sounds: No murmur heard.   No gallop.  Pulmonary:     Effort: Pulmonary effort is normal.     Breath sounds: Normal breath sounds. No wheezing or rales.  Abdominal:     Palpations: Abdomen is soft.     Tenderness: There is no abdominal tenderness.  Musculoskeletal:     Cervical back: Neck supple.     Right lower leg: No edema.     Left lower leg: No edema.  Lymphadenopathy:     Cervical: No cervical adenopathy.  Skin:    General: Skin is warm.     Findings: No rash.  Neurological:     Mental Status: She is alert and oriented to person, place, and time.     Comments: President---Biden, Trump, Obama [id number] Recall 3/3  Psychiatric:        Mood and Affect: Mood normal.        Behavior: Behavior normal.           Assessment & Plan:

## 2021-07-30 NOTE — Assessment & Plan Note (Signed)
Unclear if the brief epigastric pains are related to acid Asked her to try mylanta or similar if recurs

## 2021-07-30 NOTE — Assessment & Plan Note (Signed)
See social history 

## 2021-07-30 NOTE — Assessment & Plan Note (Signed)
I have personally reviewed the Medicare Annual Wellness questionnaire and have noted 1. The patient's medical and social history 2. Their use of alcohol, tobacco or illicit drugs 3. Their current medications and supplements 4. The patient's functional ability including ADL's, fall risks, home safety risks and hearing or visual             impairment. 5. Diet and physical activities 6. Evidence for depression or mood disorders  The patients weight, height, BMI and visual acuity have been recorded in the chart I have made referrals, counseling and provided education to the patient based review of the above and I have provided the pt with a written personalized care plan for preventive services.  I have provided you with a copy of your personalized plan for preventive services. Please take the time to review along with your updated medication list.  Will get flu vaccine next month---and bivalent COVID vaccine Yearly mammogram--due next July Colon due next year Exercises regularly Consider shingrix

## 2021-08-02 ENCOUNTER — Ambulatory Visit: Admission: EM | Admit: 2021-08-02 | Discharge: 2021-08-02 | Discharge status: Absent without leave | Payer: Medicare HMO

## 2021-08-02 ENCOUNTER — Emergency Department: Payer: Medicare HMO

## 2021-08-02 ENCOUNTER — Encounter: Payer: Self-pay | Admitting: Emergency Medicine

## 2021-08-02 ENCOUNTER — Emergency Department
Admission: EM | Admit: 2021-08-02 | Discharge: 2021-08-02 | Disposition: A | Discharge status: Home / Self Care | Payer: Medicare HMO | Attending: Emergency Medicine | Admitting: Emergency Medicine

## 2021-08-02 ENCOUNTER — Other Ambulatory Visit: Payer: Self-pay

## 2021-08-02 DIAGNOSIS — S8991XA Unspecified injury of right lower leg, initial encounter: Secondary | ICD-10-CM | POA: Diagnosis present

## 2021-08-02 DIAGNOSIS — S81011A Laceration without foreign body, right knee, initial encounter: Secondary | ICD-10-CM | POA: Diagnosis not present

## 2021-08-02 DIAGNOSIS — W010XXA Fall on same level from slipping, tripping and stumbling without subsequent striking against object, initial encounter: Secondary | ICD-10-CM | POA: Insufficient documentation

## 2021-08-02 MED ORDER — DOXYCYCLINE MONOHYDRATE 100 MG PO CAPS: 100.0000 mg | ORAL_CAPSULE | Freq: Two times a day (BID) | ORAL | 0 refills | Status: DC

## 2021-08-02 MED ORDER — BACITRACIN-NEOMYCIN-POLYMYXIN 400-5-5000 EX OINT
TOPICAL_OINTMENT | Freq: Once | CUTANEOUS | Status: AC
  Administered 2021-08-02: 1 via TOPICAL
  Filled 2021-08-02: qty 1

## 2021-08-02 MED ORDER — OXYCODONE-ACETAMINOPHEN 5-325 MG PO TABS
1.0000 | ORAL_TABLET | Freq: Once | ORAL | Status: AC
  Administered 2021-08-02: 1 via ORAL
  Filled 2021-08-02: qty 1

## 2021-08-02 MED ORDER — LIDOCAINE-EPINEPHRINE (PF) 2 %-1:200000 IJ SOLN
20.0000 mL | Freq: Once | INTRAMUSCULAR | Status: AC
  Administered 2021-08-02: 20 mL
  Filled 2021-08-02: qty 20

## 2021-08-02 NOTE — ED Triage Notes (Signed)
C/O tripped on a rock and fell forward and landed with right knee on rock.  Laceration to knee.  Bleeding controlled. Ambulatory. NAD

## 2021-08-02 NOTE — Discharge Instructions (Signed)
Read and follow discharge care instruction.  Advised extra strength Tylenol or ibuprofen as needed for pain.

## 2021-08-02 NOTE — ED Provider Notes (Signed)
[facility] Emergency Department Provider Note   ____________________________________________   Event Date/Time   First MD Initiated Contact with Patient 08/02/21 1437     (approximate)  I have reviewed the triage vital signs and the nursing notes.   HISTORY  Chief Complaint Knee Injury    HPI Shelby Sullivan is a 70 y.o. female patient presents with a laceration to left anterior knee secondary to a trip and fall on a hiking trail prior to arrival.  Patient is able to ambulate and bleeding was controlled with direct pressure.  Patient denies loss sensation or loss of function.  Patient was seen at the urgent care clinic and was advised to come to emergency room for definitive evaluation and treatment.  Patient rates her pain as a 6/10.  Patient described pain as "sore".         Past Medical History:  Diagnosis Date   Allergy    Benign tumor of breast 1996   GERD (gastroesophageal reflux disease)    Hyperlipidemia    ITP (idiopathic thrombocytopenic purpura) 1997    Patient Active Problem List   Diagnosis Date Noted   Thrombocytopenia ([place]) 01/16/2017   Advance directive discussed with patient 01/15/2017   Routine general medical examination at a health care facility 08/13/2012   Hyperlipemia 06/08/2007   Allergic rhinitis due to pollen 06/08/2007   GERD 06/08/2007    Past Surgical History:  Procedure Laterality Date   WRIST FRACTURE SURGERY  06/2010   right wrist    Prior to Admission medications   Medication Sig Start Date End Date Taking? Authorizing Provider  doxycycline (MONODOX) 100 MG capsule Take 1 capsule (100 mg total) by mouth 2 (two) times daily. 08/02/21  Yes Sable Feil, PA-C  fluticasone (FLONASE) 50 MCG/ACT nasal spray Place 2 sprays into both nostrils daily. 11/03/16   Versie Starks, PA-C  Multiple Vitamin (MULTIVITAMIN) capsule Take 1 capsule by mouth daily.    [provider]    Allergies Sulfa  antibiotics, Fentanyl, Zofran [ondansetron], and Silver sulfadiazine  Family History  Problem Relation Age of Onset   Hypertension Father    Heart disease Paternal Grandfather    Diabetes Paternal Grandfather    Thyroid nodules Mother    Hyperparathyroidism Sister    Cancer Neg Hx        breast cancer   Colon cancer Neg Hx    Esophageal cancer Neg Hx    Rectal cancer Neg Hx    Stomach cancer Neg Hx     Social History Social History   Tobacco Use   Smoking status: Never   Smokeless tobacco: Never  Substance Use Topics   Alcohol use: Yes    Alcohol/week: 7.0 standard drinks    Types: 7 Glasses of wine per week   Drug use: No    Review of Systems Constitutional: No fever/chills Eyes: No visual changes. ENT: No sore throat. Cardiovascular: Denies chest pain. Respiratory: Denies shortness of breath. Gastrointestinal: No abdominal pain.  No nausea, no vomiting.  No diarrhea.  No constipation. Genitourinary: Negative for dysuria. Musculoskeletal: Negative for back pain. Skin: Negative for rash.  Right knee laceration Neurological: Negative for headaches, focal weakness or numbness. Endocrine: Hyperlipidemia Allergic/Immunilogical: Fentanyl, sulfa antibiotics, and Zofran. ____________________________________________   PHYSICAL EXAM:  VITAL SIGNS: ED Triage Vitals  Enc Vitals Group     BP 08/02/21 1330 (!) 157/100     Pulse Rate 08/02/21 1330 72     Resp 08/02/21 1330  16     Temp 08/02/21 1330 98 F (36.7 C)     Temp Source 08/02/21 1330 Oral     SpO2 08/02/21 1330 100 %     Weight 08/02/21 1329 119 lb 0.8 oz (54 kg)     Height 08/02/21 1329 '5\' 5"'$  (1.651 m)     Head Circumference --      Peak Flow --      Pain Score 08/02/21 1329 6     Pain Loc --      Pain Edu? --      Excl. in [place]? --     Constitutional: Alert and oriented. Well appearing and in no acute distress. Eyes: Conjunctivae are normal. PERRL. EOMI. Head: Atraumatic. Nose: No  congestion/rhinnorhea. Mouth/Throat: Mucous membranes are moist.  Oropharynx non-erythematous. Neck:   No cervical spine tenderness to palpation. Cardiovascular: Normal rate, regular rhythm. Grossly normal heart sounds.  Good peripheral circulation.  Elevated blood pressure. Respiratory: Normal respiratory effort.  No retractions. Lungs CTAB. Gastrointestinal: Soft and nontender. No distention. No abdominal bruits. No CVA tenderness. Genitourinary: Deferred usculoskeletal: No lower extremity tenderness nor edema.  No joint effusions. Neurologic:  Normal speech and language. No gross focal neurologic deficits are appreciated. No gait instability. Skin:  Skin is warm, dry and intact. No rash noted.  5 cm laceration inferior right patella Psychiatric: Mood and affect are normal. Speech and behavior are normal.  ____________________________________________   LABS (all labs ordered are listed, but only abnormal results are displayed)  Labs Reviewed - No data to display ____________________________________________  EKG   ____________________________________________  RADIOLOGY I, Sable Feil, personally viewed and evaluated these images (plain radiographs) as part of my medical decision making, as well as reviewing the written report by the radiologist.  ED MD interpretation: No acute findings x-ray of the right knee.  Official radiology report(s): DG Knee Complete 4 Views Right  Result Date: 08/02/2021 CLINICAL DATA:  Fall, laceration EXAM: RIGHT KNEE - COMPLETE 4+ VIEW COMPARISON:  None. FINDINGS: There is no acute fracture or dislocation. Knee alignment is normal. There is mild medial and lateral compartment joint space narrowing with associated mild osteophytosis, consistent with osteoarthritis. There is a soft tissue defect over the patella likely corresponding to the reported laceration. There is no significant effusion. IMPRESSION: 1. No acute fracture or dislocation. 2.  Laceration over the patella. Electronically Signed   By: Valetta Mole M.D.   On: 08/02/2021 15:38    ____________________________________________   PROCEDURES  Procedure(s) performed (including Critical Care):  Marland KitchenMarland KitchenLaceration Repair  Date/Time: 08/02/2021 3:52 PM Performed by: Sable Feil, PA-C Authorized by: Sable Feil, PA-C   Consent:    Consent obtained:  Verbal   Consent given by:  Patient   Risks discussed:  Pain, poor cosmetic result, need for additional repair and poor wound healing Universal protocol:    Procedure explained and questions answered to patient or proxy's satisfaction: yes     Relevant documents present and verified: yes     Imaging studies available: yes     Immediately prior to procedure, a time out was called: yes     Patient identity confirmed:  Verbally with patient and arm band Anesthesia:    Anesthesia method:  Local infiltration Laceration details:    Location:  Leg   Leg location:  R knee   Length (cm):  5   Depth (mm):  3 Pre-procedure details:    Preparation:  Patient was prepped and draped in usual  sterile fashion and imaging obtained to evaluate for foreign bodies Exploration:    Limited defect created (wound extended): no     Hemostasis achieved with:  Direct pressure   Wound exploration: wound explored through full Ohm of motion and entire depth of wound visualized     Contaminated: no   Treatment:    Area cleansed with:  Povidone-iodine   Amount of cleaning:  Extensive   Irrigation solution:  Sterile saline   Irrigation method:  Syringe   Debridement:  None   Undermining:  None   Scar revision: no   Skin repair:    Repair method:  Sutures   Suture size:  3-0   Suture material:  Nylon   Suture technique:  Simple interrupted   Number of sutures:  17 Approximation:    Approximation:  Close Repair type:    Repair type:  Simple Post-procedure details:    Dressing:  Antibiotic ointment, non-adherent dressing and sterile  dressing   Procedure completion:  Tolerated well, no immediate complications   ____________________________________________   INITIAL IMPRESSION / ASSESSMENT AND PLAN / ED COURSE  As part of my medical decision making, I reviewed the following data within the electronic MEDICAL RECORD NUMBER         Patient presents with laceration anterior right knee secondary to a trip and fall.  See procedure note for wound closure.  Patient given discharge care instruction and prescription for Keflex.  Patient advised over-the-counter Tylenol ibuprofen as needed for pain.  Patient advised to have sutures removed in 10 days.      ____________________________________________   FINAL CLINICAL IMPRESSION(S) / ED DIAGNOSES  Final diagnoses:  Knee laceration, right, initial encounter     ED Discharge Orders          Ordered    doxycycline (MONODOX) 100 MG capsule  2 times daily        08/02/21 1551             Note:  This document was prepared using Dragon voice recognition software and may include unintentional dictation errors.    Sable Feil, PA-C 08/02/21 1601    Lavonia Drafts, MD 08/05/21 [phone]

## 2021-08-05 ENCOUNTER — Telehealth: Payer: Self-pay | Admitting: Internal Medicine

## 2021-08-05 NOTE — Telephone Encounter (Signed)
You can use a same day on the 26th.

## 2021-08-05 NOTE — Telephone Encounter (Signed)
Pt called stating that she has stitches on her knee cap from the ER on 9/16 she was wondering do she come to the office to have them remove or do she go to urgent care. They need to be remove by 9/26

## 2021-08-06 NOTE — Telephone Encounter (Signed)
Pt called in to schedule appointment

## 2021-08-06 NOTE — Telephone Encounter (Signed)
Called pt to schedule appt. Lvm to call back

## 2021-08-12 ENCOUNTER — Ambulatory Visit (INDEPENDENT_AMBULATORY_CARE_PROVIDER_SITE_OTHER): Payer: Medicare HMO | Admitting: Internal Medicine

## 2021-08-12 ENCOUNTER — Other Ambulatory Visit: Payer: Self-pay

## 2021-08-12 ENCOUNTER — Encounter: Payer: Self-pay | Admitting: Internal Medicine

## 2021-08-12 DIAGNOSIS — S81011A Laceration without foreign body, right knee, initial encounter: Secondary | ICD-10-CM | POA: Insufficient documentation

## 2021-08-12 DIAGNOSIS — S81011D Laceration without foreign body, right knee, subsequent encounter: Secondary | ICD-10-CM | POA: Diagnosis not present

## 2021-08-12 NOTE — Progress Notes (Signed)
Subjective:    Patient ID: Shelby Sullivan, female    DOB: 1951/05/09, 70 y.o.   MRN: [id number]  HPI Here for suture removal from her knee This visit occurred during the SARS-CoV-2 public health emergency.  Safety protocols were in place, including screening questions prior to the visit, additional usage of staff PPE, and extensive cleaning of exam room while observing appropriate contact time as indicated for disinfecting solutions.   Was hiking at [place] to talk to someone---and fell Jumped back up and others noticed the bleeding Knew it was bad---got compression and went to ER (after stop by urgent care --and they couldn't deal with it) Had x-ray---no fracture  17 sutures  Swelling has gone down a lot in the past few days Had antibiotic for a few days---had headache after taking it (and nausea---?med reaction)  Current Outpatient Medications on File Prior to Visit  Medication Sig Dispense Refill   doxycycline (MONODOX) 100 MG capsule Take 1 capsule (100 mg total) by mouth 2 (two) times daily. 20 capsule 0   fluticasone (FLONASE) 50 MCG/ACT nasal spray Place 2 sprays into both nostrils daily. 16 g 6   Multiple Vitamin (MULTIVITAMIN) capsule Take 1 capsule by mouth daily.     No current facility-administered medications on file prior to visit.    Allergies  Allergen Reactions   Sulfa Antibiotics Hives    Headaches, malacia, nausea   Fentanyl Other (See Comments)    After cataract surgery--CNS side effects   Zofran [Ondansetron] Other (See Comments)    After cataract surgery---CNS side effects   Silver Sulfadiazine Rash    Ears ringing, Heart palpations    Past Medical History:  Diagnosis Date   Allergy    Benign tumor of breast 1996   GERD (gastroesophageal reflux disease)    Hyperlipidemia    ITP (idiopathic thrombocytopenic purpura) 1997    Past Surgical History:  Procedure Laterality Date   WRIST FRACTURE SURGERY  06/2010   right wrist     Family History  Problem Relation Age of Onset   Hypertension Father    Heart disease Paternal Grandfather    Diabetes Paternal Grandfather    Thyroid nodules Mother    Hyperparathyroidism Sister    Cancer Neg Hx        breast cancer   Colon cancer Neg Hx    Esophageal cancer Neg Hx    Rectal cancer Neg Hx    Stomach cancer Neg Hx     Social History   Socioeconomic History   Marital status: Married    Spouse name: Not on file   Number of children: 0   Years of education: Not on file   Highest education level: Not on file  Occupational History   Occupation: Education officer, museum    Employer: [employer]    Comment: Retired  Tobacco Use   Smoking status: Never   Smokeless tobacco: Never  Substance and Sexual Activity   Alcohol use: Yes    Alcohol/week: 7.0 standard drinks    Types: 7 Glasses of wine per week   Drug use: No   Sexual activity: Not on file  Other Topics Concern   Not on file  Social History Narrative   No living will   Would want husband as POA---alternative is god-daughter Hermina Staggers   Would accept resuscitation attempts   Would not want prolonged tube feeds if cognitively unaware   Social Determinants of Health   Financial Resource Strain: Not  on file  Food Insecurity: Not on file  Transportation Needs: Not on file  Physical Activity: Not on file  Stress: Not on file  Social Connections: Not on file  Intimate Partner Violence: Not on file   Review of Systems No fever Walking fine on it     Objective:   Physical Exam Constitutional:      Appearance: Normal appearance.  Skin:    Comments: ~7cm semicircular laceration over right patella Well opposed Some redness at the top portion--slight tenderness  Neurological:     Mental Status: She is alert.           Assessment & Plan:

## 2021-08-12 NOTE — Assessment & Plan Note (Signed)
Slight inflammation but doesn't appear infected--she will monitor All sutures removed without incident Wound covered with steristrips Discussed home care No limitations for activity as tolerated

## 2021-08-19 MED ORDER — CEPHALEXIN 500 MG PO CAPS: 500.0000 mg | ORAL_CAPSULE | Freq: Three times a day (TID) | ORAL | 1 refills | Status: DC

## 2021-09-20 ENCOUNTER — Ambulatory Visit (INDEPENDENT_AMBULATORY_CARE_PROVIDER_SITE_OTHER): Payer: Medicare HMO

## 2021-09-20 ENCOUNTER — Other Ambulatory Visit: Payer: Self-pay

## 2021-09-20 DIAGNOSIS — Z23 Encounter for immunization: Secondary | ICD-10-CM

## 2021-12-20 ENCOUNTER — Telehealth: Payer: Self-pay | Admitting: Internal Medicine

## 2021-12-20 NOTE — Telephone Encounter (Signed)
Pt called stating that she need a referral to [facility] for pt hearing. Please advise.

## 2021-12-20 NOTE — Telephone Encounter (Signed)
Pt  called  to cancel referral per [facility].

## 2021-12-20 NOTE — Telephone Encounter (Signed)
Left message for pt to find out what she needs a referral for: Hearing test or a problem visit. Advised she could call back and tell whoever answers what she needs the referral for.

## 2022-06-16 ENCOUNTER — Ambulatory Visit
Admission: EM | Admit: 2022-06-16 | Discharge: 2022-06-16 | Disposition: A | Discharge status: Home / Self Care | Payer: Medicare HMO

## 2022-06-16 DIAGNOSIS — T162XXA Foreign body in left ear, initial encounter: Secondary | ICD-10-CM | POA: Diagnosis not present

## 2022-06-16 NOTE — ED Triage Notes (Signed)
Pt presents after having insect go into L ear today.  Could feel it buzzing and biting in ear.  Tried multiple methods to clean and flush it.  Hasn't heard the insect in about an hour but reports irritation in ear.

## 2022-06-16 NOTE — ED Provider Notes (Signed)
Shelby Sullivan    CSN: [id number] Arrival date & time: 06/16/22  1733      History   Chief Complaint Chief Complaint  Patient presents with   Foreign Body in Ear    HPI Shelby Sullivan is a 71 y.o. female.   Patient presents for evaluation for possible foreign body in the ear.  Endorses that she was working outside when a insect flew inside the ear canal and she began frantically screaming, unsure if bug was removed at that time.  Has attempted irrigation with water and peroxide, unsure if effective.  Endorses that the bug felt as if it was calling to get out her ear is now irritated.  Denies decreased hearing, bleeding, drainage.   Past Medical History:  Diagnosis Date   Allergy    Benign tumor of breast 1996   GERD (gastroesophageal reflux disease)    Hyperlipidemia    ITP (idiopathic thrombocytopenic purpura) 1997    Patient Active Problem List   Diagnosis Date Noted   Laceration of right knee 08/12/2021   Thrombocytopenia ([place]) 01/16/2017   Advance directive discussed with patient 01/15/2017   Routine general medical examination at a health care facility 08/13/2012   Hyperlipemia 06/08/2007   Allergic rhinitis due to pollen 06/08/2007   GERD 06/08/2007    Past Surgical History:  Procedure Laterality Date   WRIST FRACTURE SURGERY  06/2010   right wrist    OB History   No obstetric history on file.      Home Medications    Prior to Admission medications   Medication Sig Start Date End Date Taking? Authorizing Provider  cephALEXin (KEFLEX) 500 MG capsule Take 1 capsule (500 mg total) by mouth 3 (three) times daily. 08/19/21   Venia Carbon, MD  doxycycline (MONODOX) 100 MG capsule Take 1 capsule (100 mg total) by mouth 2 (two) times daily. 08/02/21   Sable Feil, PA-C  fluticasone (FLONASE) 50 MCG/ACT nasal spray Place 2 sprays into both nostrils daily. 11/03/16   Versie Starks, PA-C  Multiple Vitamin (MULTIVITAMIN) capsule Take 1 capsule by  mouth daily.    [provider]    Family History Family History  Problem Relation Age of Onset   Hypertension Father    Heart disease Paternal Grandfather    Diabetes Paternal Grandfather    Thyroid nodules Mother    Hyperparathyroidism Sister    Cancer Neg Hx        breast cancer   Colon cancer Neg Hx    Esophageal cancer Neg Hx    Rectal cancer Neg Hx    Stomach cancer Neg Hx     Social History Social History   Tobacco Use   Smoking status: Never   Smokeless tobacco: Never  Substance Use Topics   Alcohol use: Yes    Alcohol/week: 7.0 standard drinks of alcohol    Types: 7 Glasses of wine per week   Drug use: No     Allergies   Sulfa antibiotics, Fentanyl, Zofran [ondansetron], and Silver sulfadiazine   Review of Systems Review of Systems  Constitutional: Negative.   HENT: Negative.    Respiratory: Negative.    Cardiovascular: Negative.      Physical Exam Triage Vital Signs ED Triage Vitals  Enc Vitals Group     BP 06/16/22 1833 135/88     Pulse Rate 06/16/22 1833 (!) 58     Resp 06/16/22 1833 20     Temp 06/16/22 1833 98 F (  36.7 C)     Temp Source 06/16/22 1833 Oral     SpO2 06/16/22 1833 98 %     Weight --      Height --      Head Circumference --      Peak Flow --      Pain Score 06/16/22 1829 2     Pain Loc --      Pain Edu? --      Excl. in [place]? --    No data found.  Updated Vital Signs BP 135/88 (BP Location: Left Arm)   Pulse (!) 58   Temp 98 F (36.7 C) (Oral)   Resp 20   SpO2 98%   Visual Acuity Right Eye Distance:   Left Eye Distance:   Bilateral Distance:    Right Eye Near:   Left Eye Near:    Bilateral Near:     Physical Exam Constitutional:      Appearance: Normal appearance. She is normal weight.  HENT:     Head: Normocephalic.     Right Ear: Tympanic membrane, ear canal and external ear normal.     Left Ear: Tympanic membrane, ear canal and external ear normal.  Eyes:     Extraocular Movements:  Extraocular movements intact.  Pulmonary:     Effort: Pulmonary effort is normal.  Neurological:     Mental Status: She is alert.      UC Treatments / Results  Labs (all labs ordered are listed, but only abnormal results are displayed) Labs Reviewed - No data to display  EKG   Radiology No results found.  Procedures Procedures (including critical care time)  Medications Ordered in UC Medications - No data to display  Initial Impression / Assessment and Plan / UC Course  I have reviewed the triage vital signs and the nursing notes.  Pertinent labs & imaging results that were available during my care of the patient were reviewed by me and considered in my medical decision making (see chart for details).  Foreign body of left middle ear, initial encounter  No abnormalities are noted within the ear canal, no foreign body is present at this time, discussed with patient, recommended supportive care for management of discomfort through use of over-the-counter analgesics, eardrops, warm compresses to the external canal, recommended against any ear cleaning, object or fluid placement into the canal to prevent further irritation, may follow-up with urgent care or PCP for reevaluation as needed Final Clinical Impressions(s) / UC Diagnoses   Final diagnoses:  Foreign body of left middle ear, initial encounter     Discharge Instructions      On exam there are no abnormalities to the ear and the offending agent is no longer present  You may use Tylenol and/or ibuprofen every 6 hours as needed for management of discomfort and irritation  You may use over-the-counter eardrops as needed for additional comfort  You may hold warm compresses to the affected ear for comfort  Please avoid any ear cleaning, object or fluid placement into the ears until discomfort has resolved  May follow-up with urgent care or primary care doctor as needed for reevaluation   ED Prescriptions    None    PDMP not reviewed this encounter.   Hans Eden, [place] 06/16/22 [phone]

## 2022-06-16 NOTE — Discharge Instructions (Signed)
On exam there are no abnormalities to the ear and the offending agent is no longer present  You may use Tylenol and/or ibuprofen every 6 hours as needed for management of discomfort and irritation  You may use over-the-counter eardrops as needed for additional comfort  You may hold warm compresses to the affected ear for comfort  Please avoid any ear cleaning, object or fluid placement into the ears until discomfort has resolved  May follow-up with urgent care or primary care doctor as needed for reevaluation

## 2022-06-20 LAB — HM MAMMOGRAPHY

## 2022-08-01 ENCOUNTER — Encounter: Payer: Medicare HMO | Admitting: Internal Medicine

## 2022-08-13 ENCOUNTER — Encounter: Payer: Self-pay | Admitting: Internal Medicine

## 2022-08-13 ENCOUNTER — Ambulatory Visit (INDEPENDENT_AMBULATORY_CARE_PROVIDER_SITE_OTHER): Payer: Medicare HMO | Admitting: Internal Medicine

## 2022-08-13 VITALS — BP 120/82 | HR 62 | Temp 97.8°F | Ht 65.5 in | Wt 121.0 lb

## 2022-08-13 DIAGNOSIS — E785 Hyperlipidemia, unspecified: Secondary | ICD-10-CM

## 2022-08-13 DIAGNOSIS — Z0000 Encounter for general adult medical examination without abnormal findings: Secondary | ICD-10-CM | POA: Diagnosis not present

## 2022-08-13 DIAGNOSIS — J301 Allergic rhinitis due to pollen: Secondary | ICD-10-CM

## 2022-08-13 DIAGNOSIS — Z23 Encounter for immunization: Secondary | ICD-10-CM | POA: Diagnosis not present

## 2022-08-13 DIAGNOSIS — K2100 Gastro-esophageal reflux disease with esophagitis, without bleeding: Secondary | ICD-10-CM | POA: Diagnosis not present

## 2022-08-13 DIAGNOSIS — D696 Thrombocytopenia, unspecified: Secondary | ICD-10-CM

## 2022-08-13 LAB — LIPID PANEL
Cholesterol: 219 mg/dL — ABNORMAL HIGH (ref 0–200)
HDL: 74 mg/dL (ref >39.00)
LDL Cholesterol: 132 mg/dL — ABNORMAL HIGH (ref 0–99)
NonHDL: 144.54
Total CHOL/HDL Ratio: 3
Triglycerides: 62 mg/dL (ref 0.0–149.0)
VLDL: 12.4 mg/dL (ref 0.0–40.0)

## 2022-08-13 LAB — CBC
HCT: 40.6 % (ref 36.0–46.0)
Hemoglobin: 13.7 g/dL (ref 12.0–15.0)
MCHC: 33.8 g/dL (ref 30.0–36.0)
MCV: 91.3 fl (ref 78.0–100.0)
Platelets: 119 10*3/uL — ABNORMAL LOW (ref 150.0–400.0)
RBC: 4.45 Mil/uL (ref 3.87–5.11)
RDW: 13.5 % (ref 11.5–15.5)
WBC: 4.2 10*3/uL (ref 4.0–10.5)

## 2022-08-13 LAB — COMPREHENSIVE METABOLIC PANEL
ALT: 25 U/L (ref 0–35)
AST: 27 U/L (ref 0–37)
Albumin: 4.5 g/dL (ref 3.5–5.2)
Alkaline Phosphatase: 51 U/L (ref 39–117)
BUN: 13 mg/dL (ref 6–23)
CO2: 30 mEq/L (ref 19–32)
Calcium: 9.4 mg/dL (ref 8.4–10.5)
Chloride: 103 mEq/L (ref 96–112)
Creatinine, Ser: 0.72 mg/dL (ref 0.40–1.20)
GFR: 82.89 mL/min (ref >60.00)
Glucose, Bld: 96 mg/dL (ref 70–99)
Potassium: 4.6 mEq/L (ref 3.5–5.1)
Sodium: 138 mEq/L (ref 135–145)
Total Bilirubin: 0.8 mg/dL (ref 0.2–1.2)
Total Protein: 6.5 g/dL (ref 6.0–8.3)

## 2022-08-13 LAB — TSH: TSH: 0.57 u[IU]/mL (ref 0.35–5.50)

## 2022-08-13 NOTE — Assessment & Plan Note (Signed)
No abnormal bleeding Will recheck

## 2022-08-13 NOTE — Assessment & Plan Note (Signed)
Prefers no meds 

## 2022-08-13 NOTE — Progress Notes (Signed)
Hearing Screening - Comments:: March 2023. Suggested hearing aids. Vision Screening - Comments:: May 2023

## 2022-08-13 NOTE — Addendum Note (Signed)
Addended by: Pilar Grammes on: 08/13/2022 12:26 PM   Modules accepted: Orders

## 2022-08-13 NOTE — Assessment & Plan Note (Signed)
I have personally reviewed the Medicare Annual Wellness questionnaire and have noted 1. The patient's medical and social history 2. Their use of alcohol, tobacco or illicit drugs 3. Their current medications and supplements 4. The patient's functional ability including ADL's, fall risks, home safety risks and hearing or visual             impairment. 5. Diet and physical activities 6. Evidence for depression or mood disorders  The patients weight, height, BMI and visual acuity have been recorded in the chart I have made referrals, counseling and provided education to the patient based review of the above and I have provided the pt with a written personalized care plan for preventive services.  I have provided you with a copy of your personalized plan for preventive services. Please take the time to review along with your updated medication list.  Exercises regularly Colon due later this year Yearly mammogram still--maybe till 78 Flu vaccine today Updated COVID soon Consider shingrix at the pharmacy

## 2022-08-13 NOTE — Assessment & Plan Note (Signed)
Sounds like her recent spell was esophagitis Discussed using the omeprazole daily for 1-2 weeks for recurrence (or prn)

## 2022-08-13 NOTE — Assessment & Plan Note (Signed)
Discussed --fairly low risk No statin Will recheck

## 2022-08-13 NOTE — Progress Notes (Signed)
Subjective:    Patient ID: Shelby Sullivan, female    DOB: [birth year], 71 y.o.   MRN: [id number]  HPI Here for Medicare wellness visit and follow up of chronic health conditions Reviewed advanced directives Reviewed other doctors---Dr Shelby Sullivan, Dr Shelby Sullivan, Dr Shelby Sullivan--Shelby Sullivan, Shelby Sullivan Shelby Sullivan) No hospitalizations or surgery this year Vision is okay Hearing is mildly down--not enough for aides Glass of wine with dinner No tobacco Regular exercise [place] once hiking---had knee laceration No depression or anhedonia Independent with instrumental ADLs No sig memory issues  In August--was having pain under his ribs Initially worried about her heart--then seemed GI Went to her right shoulder blade Not related to eating No heartburn or dysphagia---but had GI bug briefly and sense of bile in throat. Then the symptoms happened after that Bowels generally okay  Chronically low platelets No abnormal bleeding or bruising  Mildly elevated cholesterol Good HDL though  Some hand swelling and left 3rd DIP pain No issues per hand surgeon  Chronic allergies Fall is bad--but not sure about meds--not using  Current Outpatient Medications on File Prior to Visit  Medication Sig Dispense Refill   Multiple Vitamin (MULTIVITAMIN) capsule Take 1 capsule by mouth daily.     No current facility-administered medications on file prior to visit.    Allergies  Allergen Reactions   Sulfa Antibiotics Hives    Headaches, malacia, nausea   Fentanyl Other (See Comments)    After cataract surgery--CNS side effects   Zofran [Ondansetron] Other (See Comments)    After cataract surgery---CNS side effects   Silver Sulfadiazine Rash    Ears ringing, Heart palpations    Past Medical History:  Diagnosis Date   Allergy    Benign tumor of breast 1996   GERD (gastroesophageal reflux disease)    Hyperlipidemia    ITP (idiopathic thrombocytopenic purpura) 1997    Past Surgical  History:  Procedure Laterality Date   WRIST FRACTURE SURGERY  06/2010   right wrist    Family History  Problem Relation Age of Onset   Hypertension Father    Heart disease Paternal Grandfather    Diabetes Paternal Grandfather    Thyroid nodules Mother    Hyperparathyroidism Sister    Cancer Neg Hx        breast cancer   Colon cancer Neg Hx    Esophageal cancer Neg Hx    Rectal cancer Neg Hx    Stomach cancer Neg Hx     Social History   Socioeconomic History   Marital status: Married    Spouse name: Not on file   Number of children: 0   Years of education: Not on file   Highest education level: Not on file  Occupational History   Occupation: Education officer, museum    Employer: [employer]    Comment: Retired  Tobacco Use   Smoking status: Never    Passive exposure: Past   Smokeless tobacco: Never  Substance and Sexual Activity   Alcohol use: Yes    Alcohol/week: 7.0 standard drinks of alcohol    Types: 7 Glasses of wine per week   Drug use: No   Sexual activity: Not on file  Other Topics Concern   Not on file  Social History Narrative   No living will   Would want husband as POA---alternative is god-daughter Hermina Sullivan   Would accept resuscitation attempts   Would not want prolonged tube feeds if cognitively unaware   Social Determinants of Health   Financial  Resource Strain: Not on file  Food Insecurity: Not on file  Transportation Needs: Not on file  Physical Activity: Not on file  Stress: Not on file  Social Connections: Not on file  Intimate Partner Violence: Not on file   Review of Systems Appetite is good Weight stable Sleeps well Wears seat belt Teeth are fine--keeps up with dentist No suspicious skin lesions now No other joint or back problems No urinary issues---no incontinence No other chest pain No SOB--did have sense of SOB just with that pain No dizziness or syncope No edema     Objective:   Physical Exam Constitutional:       Appearance: Normal appearance.  HENT:     Mouth/Throat:     Comments: No lesions Eyes:     Conjunctiva/sclera: Conjunctivae normal.     Pupils: Pupils are equal, round, and reactive to light.  Cardiovascular:     Rate and Rhythm: Normal rate and regular rhythm.     Pulses: Normal pulses.     Heart sounds: No murmur heard.    No gallop.  Pulmonary:     Effort: Pulmonary effort is normal.     Breath sounds: Normal breath sounds. No wheezing or rales.  Abdominal:     Palpations: Abdomen is soft.     Tenderness: There is no abdominal tenderness.  Musculoskeletal:     Cervical back: Neck supple.     Right lower leg: No edema.     Left lower leg: No edema.  Lymphadenopathy:     Cervical: No cervical adenopathy.  Skin:    Findings: No lesion or rash.  Neurological:     General: No focal deficit present.     Mental Status: She is alert and oriented to person, place, and time.     Comments: Mini-cog normal  Psychiatric:        Mood and Affect: Mood normal.        Behavior: Behavior normal.            Assessment & Plan:

## 2022-09-19 ENCOUNTER — Encounter: Payer: Self-pay | Admitting: Internal Medicine

## 2023-01-07 ENCOUNTER — Ambulatory Visit: Payer: Medicare HMO

## 2023-01-07 ENCOUNTER — Ambulatory Visit (LOCAL_COMMUNITY_HEALTH_CENTER): Payer: Medicare HMO

## 2023-01-07 DIAGNOSIS — Z23 Encounter for immunization: Secondary | ICD-10-CM

## 2023-01-07 DIAGNOSIS — Z719 Counseling, unspecified: Secondary | ICD-10-CM

## 2023-01-07 NOTE — Progress Notes (Signed)
Pt seen in clinic for [employer] vaccine. Eligible, administered [place] 12y+, yr 2023-2024. Monitored for 15 min without any problems. Provided VIS and NCIR copy. M.Armond Cuthrell, LPN.

## 2023-02-11 ENCOUNTER — Telehealth (INDEPENDENT_AMBULATORY_CARE_PROVIDER_SITE_OTHER): Payer: Medicare HMO | Admitting: Internal Medicine

## 2023-02-11 ENCOUNTER — Encounter: Payer: Self-pay | Admitting: Internal Medicine

## 2023-02-11 VITALS — Temp 102.0°F

## 2023-02-11 DIAGNOSIS — U071 COVID-19: Secondary | ICD-10-CM

## 2023-02-11 MED ORDER — HYDROCODONE BIT-HOMATROP MBR 5-1.5 MG/5ML PO SOLN: 5.0000 mL | Freq: Every evening | ORAL | 0 refills | Status: DC | PRN

## 2023-02-11 NOTE — Assessment & Plan Note (Addendum)
Mild illness though some fever Seems to be breathing fine Discussed paxlovid--but prefers not (did have the recent COVID booster) Will send hycodan for bedtime Discussed quarantine and then masking ER evaluation if sig shortness or breath

## 2023-02-11 NOTE — Progress Notes (Signed)
Subjective:    Patient ID: Shelby Sullivan, female    DOB: 16-Dec-1950, 72 y.o.   MRN: [id number]  HPI Video virtual visit due to COVID infection Identification done Reviewed limitations and billing and she gave consent Participants--patient in her home and I am in my office  Just came back from trip to [place] a few days ago Had mild bronchitis and chest tightness---but not bad Tested 3 days ago--negative Yesterday morning---she had bad throat pain "like a hot poker down my throat" Some cough--mucinex DM helps some but not at night Noted fever yesterday--so started advil---some help  Bad headache especially at night Tested positive yesterday and was positive Did have sense of trouble getting breath  Current Outpatient Medications on File Prior to Visit  Medication Sig Dispense Refill   Multiple Vitamin (MULTIVITAMIN) capsule Take 1 capsule by mouth daily. (Patient not taking: Reported on 02/11/2023)     No current facility-administered medications on file prior to visit.    Allergies  Allergen Reactions   Sulfa Antibiotics Hives    Headaches, malacia, nausea   Fentanyl Other (See Comments)    After cataract surgery--CNS side effects   Zofran [Ondansetron] Other (See Comments)    After cataract surgery---CNS side effects   Silver Sulfadiazine Rash    Ears ringing, Heart palpations    Past Medical History:  Diagnosis Date   Allergy    Benign tumor of breast 1996   GERD (gastroesophageal reflux disease)    Hyperlipidemia    ITP (idiopathic thrombocytopenic purpura) 1997    Past Surgical History:  Procedure Laterality Date   WRIST FRACTURE SURGERY  06/2010   right wrist    Family History  Problem Relation Age of Onset   Hypertension Father    Heart disease Paternal Grandfather    Diabetes Paternal Grandfather    Thyroid nodules Mother    Hyperparathyroidism Sister    Cancer Neg Hx        breast cancer   Colon cancer Neg Hx    Esophageal cancer Neg Hx     Rectal cancer Neg Hx    Stomach cancer Neg Hx     Social History   Socioeconomic History   Marital status: Married    Spouse name: Not on file   Number of children: 0   Years of education: Not on file   Highest education level: Not on file  Occupational History   Occupation: Education officer, museum    Employer: [employer]    Comment: Retired  Tobacco Use   Smoking status: Never    Passive exposure: Past   Smokeless tobacco: Never  Substance and Sexual Activity   Alcohol use: Yes    Alcohol/week: 7.0 standard drinks of alcohol    Types: 7 Glasses of wine per week   Drug use: No   Sexual activity: Not on file  Other Topics Concern   Not on file  Social History Narrative   No living will   Would want husband as POA---alternative is god-daughter Hermina Staggers   Would accept resuscitation attempts   Would not want prolonged tube feeds if cognitively unaware   Social Determinants of Health   Financial Resource Strain: Not on file  Food Insecurity: Not on file  Transportation Needs: Not on file  Physical Activity: Not on file  Stress: Not on file  Social Connections: Not on file  Intimate Partner Violence: Not on file   Review of Systems No loss of smell or taste No N/V  Eating but not much appetite      Objective:   Physical Exam Constitutional:      Appearance: Normal appearance.  Pulmonary:     Effort: Pulmonary effort is normal. No respiratory distress.  Neurological:     Mental Status: She is alert.            Assessment & Plan:

## 2023-03-19 ENCOUNTER — Other Ambulatory Visit: Payer: Self-pay | Admitting: Otolaryngology

## 2023-03-19 DIAGNOSIS — R221 Localized swelling, mass and lump, neck: Secondary | ICD-10-CM

## 2023-03-31 ENCOUNTER — Ambulatory Visit
Admission: RE | Admit: 2023-03-31 | Discharge: 2023-03-31 | Disposition: A | Discharge status: Home / Self Care | Payer: Medicare HMO | Source: Ambulatory Visit | Attending: Otolaryngology | Admitting: Otolaryngology

## 2023-03-31 DIAGNOSIS — R221 Localized swelling, mass and lump, neck: Secondary | ICD-10-CM

## 2023-07-09 LAB — HM MAMMOGRAPHY

## 2023-08-19 ENCOUNTER — Ambulatory Visit: Payer: Medicare HMO | Admitting: Internal Medicine

## 2023-08-19 ENCOUNTER — Encounter: Payer: Self-pay | Admitting: Internal Medicine

## 2023-08-19 VITALS — BP 124/84 | HR 60 | Temp 97.9°F | Ht 65.25 in | Wt 115.0 lb

## 2023-08-19 DIAGNOSIS — E785 Hyperlipidemia, unspecified: Secondary | ICD-10-CM

## 2023-08-19 DIAGNOSIS — D696 Thrombocytopenia, unspecified: Secondary | ICD-10-CM | POA: Diagnosis not present

## 2023-08-19 DIAGNOSIS — Z1159 Encounter for screening for other viral diseases: Secondary | ICD-10-CM

## 2023-08-19 DIAGNOSIS — Z0000 Encounter for general adult medical examination without abnormal findings: Secondary | ICD-10-CM

## 2023-08-19 DIAGNOSIS — Z23 Encounter for immunization: Secondary | ICD-10-CM

## 2023-08-19 DIAGNOSIS — K219 Gastro-esophageal reflux disease without esophagitis: Secondary | ICD-10-CM

## 2023-08-19 DIAGNOSIS — J301 Allergic rhinitis due to pollen: Secondary | ICD-10-CM | POA: Diagnosis not present

## 2023-08-19 LAB — COMPREHENSIVE METABOLIC PANEL
ALT: 17 U/L (ref 0–35)
AST: 22 U/L (ref 0–37)
Albumin: 4.3 g/dL (ref 3.5–5.2)
Alkaline Phosphatase: 54 U/L (ref 39–117)
BUN: 11 mg/dL (ref 6–23)
CO2: 30 meq/L (ref 19–32)
Calcium: 9.2 mg/dL (ref 8.4–10.5)
Chloride: 104 meq/L (ref 96–112)
Creatinine, Ser: 0.65 mg/dL (ref 0.40–1.20)
GFR: 86.66 mL/min (ref >60.00)
Glucose, Bld: 89 mg/dL (ref 70–99)
Potassium: 4.3 meq/L (ref 3.5–5.1)
Sodium: 140 meq/L (ref 135–145)
Total Bilirubin: 0.7 mg/dL (ref 0.2–1.2)
Total Protein: 6.4 g/dL (ref 6.0–8.3)

## 2023-08-19 LAB — LIPID PANEL
Cholesterol: 213 mg/dL — ABNORMAL HIGH (ref 0–200)
HDL: 70.8 mg/dL (ref >39.00)
LDL Cholesterol: 129 mg/dL — ABNORMAL HIGH (ref 0–99)
NonHDL: 141.86
Total CHOL/HDL Ratio: 3
Triglycerides: 65 mg/dL (ref 0.0–149.0)
VLDL: 13 mg/dL (ref 0.0–40.0)

## 2023-08-19 LAB — CBC
HCT: 41.2 % (ref 36.0–46.0)
Hemoglobin: 13.4 g/dL (ref 12.0–15.0)
MCHC: 32.6 g/dL (ref 30.0–36.0)
MCV: 92.9 fL (ref 78.0–100.0)
Platelets: 131 10*3/uL — ABNORMAL LOW (ref 150.0–400.0)
RBC: 4.44 Mil/uL (ref 3.87–5.11)
RDW: 13.3 % (ref 11.5–15.5)
WBC: 4.2 10*3/uL (ref 4.0–10.5)

## 2023-08-19 NOTE — Assessment & Plan Note (Signed)
May be causing cough and stuffiness Recommended allegra at bedtime in allergy season

## 2023-08-19 NOTE — Assessment & Plan Note (Signed)
Chronic and mild  Will recheck

## 2023-08-19 NOTE — Assessment & Plan Note (Signed)
I have personally reviewed the Medicare Annual Wellness questionnaire and have noted 1. The patient's medical and social history 2. Their use of alcohol, tobacco or illicit drugs 3. Their current medications and supplements 4. The patient's functional ability including ADL's, fall risks, home safety risks and hearing or visual             impairment. 5. Diet and physical activities 6. Evidence for depression or mood disorders  The patients weight, height, BMI and visual acuity have been recorded in the chart I have made referrals, counseling and provided education to the patient based review of the above and I have provided the pt with a written personalized care plan for preventive services.  I have provided you with a copy of your personalized plan for preventive services. Please take the time to review along with your updated medication list.  Needs last screening colonoscopy Mammogram every 1-2 years till 74 Exercises regularly Flu vaccine today Will consider COVID and RSV vaccines --as well as shingrix

## 2023-08-19 NOTE — Patient Instructions (Signed)
Please call to set up your screening colonoscopy.

## 2023-08-19 NOTE — Assessment & Plan Note (Signed)
May be the reason for the episodic sharp pain under sternum (?esophageal spasm) Discussed using the omeprazole 20mg  daily on empty stomach for at least a few days if this starts up

## 2023-08-19 NOTE — Assessment & Plan Note (Signed)
Chronic Discussed statin--will hold off

## 2023-08-19 NOTE — Addendum Note (Signed)
Addended by: Eual Fines on: 08/19/2023 10:10 AM   Modules accepted: Orders

## 2023-08-19 NOTE — Progress Notes (Signed)
Hearing Screening - Comments:: May 2024. Doe snot need hearing aids.  Vision Screening - Comments:: May 2024

## 2023-08-19 NOTE — Progress Notes (Signed)
Subjective:    Patient ID: Shelby Sullivan, female    DOB: Mar 22, 1951, 72 y.o.   MRN: [id number]  HPI Here for Medicare wellness visit and follow up of chronic health conditions Reviewed advanced directives Reviewed other doctors---Dr Arnaldo Natal, Dr Hildred Alamin, Dr Masko--dentist, Wynelle Beckmann NP--derm, Dr Shelton Silvas No hospitalizations or surgery in the past year Exercises regularly Vision is fine since cataracts Hearing is mildly down-not ready for hearing aides No falls No depression or anhedonia Independent with instrumental ADLs No memory issues  Still gets sporadic sharp pains under sternum 5 days in a row in May Then episodic in past month (after months off) Usually in the afternoon---sharp, intense ---very short (at most 20 minutes--then fades) Never during activities--usually just sitting around Maybe 45 minutes after eating No N/V Bowels move regular No heartburn or dysphagia  No chest pain or SOB Fitness level has been affected by age--but no striking change (still plays doubles tennis) No dizziness or syncope--but head feels stuffed at times No regular allergy meds---but does take allegra prn  Reviewed labs Stable mildly low platelets at 119K No abnormal bleeding. Some easy bruising  Current Outpatient Medications on File Prior to Visit  Medication Sig Dispense Refill   Multiple Vitamin (MULTIVITAMIN) capsule Take 1 capsule by mouth daily.     No current facility-administered medications on file prior to visit.    Allergies  Allergen Reactions   Sulfa Antibiotics Hives    Headaches, malacia, nausea   Fentanyl Other (See Comments)    After cataract surgery--CNS side effects   Zofran [Ondansetron] Other (See Comments)    After cataract surgery---CNS side effects   Silver Sulfadiazine Rash    Ears ringing, Heart palpations    Past Medical History:  Diagnosis Date   Allergy    Benign tumor of breast 1996   GERD (gastroesophageal reflux  disease)    Hyperlipidemia    ITP (idiopathic thrombocytopenic purpura) 1997    Past Surgical History:  Procedure Laterality Date   WRIST FRACTURE SURGERY  06/2010   right wrist    Family History  Problem Relation Age of Onset   Hypertension Father    Heart disease Paternal Grandfather    Diabetes Paternal Grandfather    Thyroid nodules Mother    Hyperparathyroidism Sister    Cancer Neg Hx        breast cancer   Colon cancer Neg Hx    Esophageal cancer Neg Hx    Rectal cancer Neg Hx    Stomach cancer Neg Hx     Social History   Socioeconomic History   Marital status: Married    Spouse name: Not on file   Number of children: 0   Years of education: Not on file   Highest education level: Not on file  Occupational History   Occupation: Child psychotherapist    Employer: [employer]    Comment: Retired  Tobacco Use   Smoking status: Never    Passive exposure: Past   Smokeless tobacco: Never  Substance and Sexual Activity   Alcohol use: Yes    Alcohol/week: 7.0 standard drinks of alcohol    Types: 7 Glasses of wine per week   Drug use: No   Sexual activity: Not on file  Other Topics Concern   Not on file  Social History Narrative   No living will   Would want husband as POA---alternative is god-daughter Darrick Huntsman   Would accept resuscitation attempts   Would not want prolonged tube  feeds if cognitively unaware   Social Determinants of Health   Financial Resource Strain: Not on file  Food Insecurity: Not on file  Transportation Needs: Not on file  Physical Activity: Not on file  Stress: Not on file  Social Connections: Not on file  Intimate Partner Violence: Not on file   Review of Systems Appetite is good Weight is stable Sleeps well Wears seat belt Teeth okay---keeps up with dentist No suspicious skin lesions Bowels move fine--no blood No sig back pain. Some right knee pain--wears brace with activity (and takes advil before-- 200mg )    Objective:    Physical Exam Constitutional:      Appearance: Normal appearance.  HENT:     Mouth/Throat:     Pharynx: No oropharyngeal exudate or posterior oropharyngeal erythema.  Eyes:     Conjunctiva/sclera: Conjunctivae normal.     Pupils: Pupils are equal, round, and reactive to light.  Cardiovascular:     Rate and Rhythm: Normal rate and regular rhythm.     Pulses: Normal pulses.     Heart sounds:     No gallop.     Comments: ?trace systolic murmur at apex Pulmonary:     Effort: Pulmonary effort is normal.     Breath sounds: Normal breath sounds. No wheezing or rales.  Abdominal:     Palpations: Abdomen is soft.     Tenderness: There is no abdominal tenderness.  Musculoskeletal:     Cervical back: Neck supple.     Right lower leg: No edema.     Left lower leg: No edema.  Lymphadenopathy:     Cervical: No cervical adenopathy.  Skin:    Findings: No rash.  Neurological:     General: No focal deficit present.     Mental Status: She is alert and oriented to person, place, and time.     Comments: Word naming---7 then blanked Recall --- 3/3  Psychiatric:        Mood and Affect: Mood normal.        Behavior: Behavior normal.            Assessment & Plan:

## 2023-08-20 LAB — HEPATITIS C ANTIBODY: Hepatitis C Ab: NONREACTIVE

## 2024-07-14 LAB — HM MAMMOGRAPHY

## 2024-08-19 ENCOUNTER — Encounter: Payer: Self-pay | Admitting: General Practice

## 2024-08-19 ENCOUNTER — Ambulatory Visit (INDEPENDENT_AMBULATORY_CARE_PROVIDER_SITE_OTHER): Admitting: General Practice

## 2024-08-19 VITALS — BP 128/80 | HR 74 | Temp 97.8°F | Ht 65.9 in | Wt 118.0 lb

## 2024-08-19 DIAGNOSIS — Z7689 Persons encountering health services in other specified circumstances: Secondary | ICD-10-CM | POA: Insufficient documentation

## 2024-08-19 DIAGNOSIS — I739 Peripheral vascular disease, unspecified: Secondary | ICD-10-CM

## 2024-08-19 DIAGNOSIS — Z23 Encounter for immunization: Secondary | ICD-10-CM

## 2024-08-19 DIAGNOSIS — Z1211 Encounter for screening for malignant neoplasm of colon: Secondary | ICD-10-CM

## 2024-08-19 NOTE — Patient Instructions (Addendum)
 You will either be contacted via phone regarding your referral to gastroenterology , or you may receive a letter on your MyChart portal from our referral team with instructions for scheduling an appointment. Please let us  know if you have not been contacted by anyone within two weeks.  Schedule physical for next week with fasting labs.   It was a pleasure meeting you!

## 2024-08-19 NOTE — Assessment & Plan Note (Signed)
 EMR reviewed briefly.

## 2024-08-19 NOTE — Progress Notes (Signed)
 New Patient Office Visit  Subjective    Patient ID: Shelby Sullivan, female    DOB: Jul 05, 1951  Age: 73 y.o. MRN: [id number]  CC:  Chief Complaint  Patient presents with   New Patient (Initial Visit)    TOC from Dr. Jimmy   Referral    Needs referral for GI for colonoscopy    PAD    Patient was told she has PAD from insurance company when she had a work up done. She sometimes get fatigue and SOB she wants to discuss as she did research this was related.     HPI Shelby Sullivan is a 73 y.o. female presents to establish care. Previous PCP/physical/labs: Dr. Renata last physical and labs- October, 2024.   Discussed the use of AI scribe software for clinical note transcription with the patient, who gave verbal consent to proceed.  History of Present Illness Shelby Sullivan is a 73 year old female who presents with concerns about peripheral artery disease.   She received a letter indicating peripheral artery disease (PAD) with 20% blockage in right leg and 30% in left leg,  following a home screening by a company contracted with her insurance. She has not previously been informed of this condition. She describes occasional aching in her calves, particularly after being on her feet all day, which she attributes to her activity level. She experiences sporadic shortness of breath, primarily in the mornings, but it resolves by the afternoon. No persistent shortness of breath throughout the day.  She experienced significant abdominal pain on Tuesday. She has a history of diverticulosis, as noted in her colonoscopy reports. The pain has resolved on its own and she currently has no fever, chills, or nausea. Her last colonoscopy was in 2013, and she has not had any polyps in her last few screenings.   Outpatient Encounter Medications as of 08/19/2024  Medication Sig   [DISCONTINUED] Multiple Vitamin (MULTIVITAMIN) capsule Take 1 capsule by mouth daily.   No facility-administered encounter  medications on file as of 08/19/2024.    Past Medical History:  Diagnosis Date   Allergy    Benign tumor of breast 1996   GERD (gastroesophageal reflux disease)    Hyperlipidemia    ITP (idiopathic thrombocytopenic purpura) 1997    Past Surgical History:  Procedure Laterality Date   WRIST FRACTURE SURGERY  06/2010   right wrist    Family History  Problem Relation Age of Onset   Hypertension Father    Heart disease Paternal Grandfather    Diabetes Paternal Grandfather    Thyroid  nodules Mother    Hyperparathyroidism Sister    Cancer Neg Hx        breast cancer   Colon cancer Neg Hx    Esophageal cancer Neg Hx    Rectal cancer Neg Hx    Stomach cancer Neg Hx     Social History   Socioeconomic History   Marital status: Married    Spouse name: Not on file   Number of children: 0   Years of education: Not on file   Highest education level: Master's degree (e.g., MA, MS, MEng, MEd, MSW, MBA)  Occupational History   Occupation: Child psychotherapist    Employer: [employer]    Comment: Retired  Tobacco Use   Smoking status: Never    Passive exposure: Past   Smokeless tobacco: Never  Substance and Sexual Activity   Alcohol use: Yes    Alcohol/week: 7.0 standard drinks of alcohol  Types: 7 Glasses of wine per week   Drug use: No   Sexual activity: Not on file  Other Topics Concern   Not on file  Social History Narrative   No living will   Would want husband as POA---alternative is god-daughter Lyle Fisher   Would accept resuscitation attempts   Would not want prolonged tube feeds if cognitively unaware   Social Drivers of Health   Financial Resource Strain: Low Risk  (08/15/2024)   Overall Financial Resource Strain (CARDIA)    Difficulty of Paying Living Expenses: Not hard at all  Food Insecurity: No Food Insecurity (08/15/2024)   Hunger Vital Sign    Worried About Running Out of Food in the Last Year: Never true    Ran Out of Food in the Last Year: Never true   Transportation Needs: No Transportation Needs (08/15/2024)   PRAPARE - Administrator, Civil Service (Medical): No    Lack of Transportation (Non-Medical): No  Physical Activity: Sufficiently Active (08/15/2024)   Exercise Vital Sign    Days of Exercise per Week: 6 days    Minutes of Exercise per Session: 60 min  Stress: Stress Concern Present (08/15/2024)   [employer] of Occupational Health - Occupational Stress Questionnaire    Feeling of Stress: To some extent  Social Connections: Socially Integrated (08/15/2024)   Social Connection and Isolation Panel    Frequency of Communication with Friends and Family: Twice a week    Frequency of Social Gatherings with Friends and Family: Three times a week    Attends Religious Services: More than 4 times per year    Active Member of Clubs or Organizations: Yes    Attends Banker Meetings: Not on file    Marital Status: Married  Catering manager Violence: Not on file    Review of Systems  Constitutional:  Negative for chills and fever.  Respiratory:  Negative for shortness of breath.   Cardiovascular:  Negative for chest pain.  Gastrointestinal:  Negative for abdominal pain, constipation, diarrhea, heartburn, nausea and vomiting.  Genitourinary:  Negative for dysuria, frequency and urgency.  Musculoskeletal:        Left calf pain.  Neurological:  Negative for dizziness and headaches.  Endo/Heme/Allergies:  Negative for polydipsia.  Psychiatric/Behavioral:  Negative for depression and suicidal ideas. The patient is not nervous/anxious.        Objective    BP 128/80   Pulse 74   Temp 97.8 F (36.6 C) (Oral)   Ht 5' 5.9 (1.674 m)   Wt 118 lb (53.5 kg)   SpO2 98%   BMI 19.10 kg/m   Physical Exam Vitals and nursing note reviewed.  Constitutional:      Appearance: Normal appearance.  Eyes:     Conjunctiva/sclera: Conjunctivae normal.  Cardiovascular:     Rate and Rhythm: Normal rate and regular  rhythm.     Pulses: Normal pulses.     Heart sounds: Normal heart sounds.  Pulmonary:     Effort: Pulmonary effort is normal.     Breath sounds: Normal breath sounds.  Neurological:     Mental Status: She is alert and oriented to person, place, and time.  Psychiatric:        Mood and Affect: Mood normal.        Behavior: Behavior normal.        Thought Content: Thought content normal.        Judgment: Judgment normal.  Assessment & Plan:  PAD (peripheral artery disease) -     Ambulatory referral to Vascular Surgery  Establishing care with new doctor, encounter for Assessment & Plan: EMR reviewed briefly.    Screening for colon cancer -     Ambulatory referral to Gastroenterology  Need for influenza vaccination -     Flu vaccine HIGH DOSE PF(Fluzone Trivalent)    Assessment and Plan Assessment & Plan Peripheral artery disease, right leg severe, left leg moderate Severe PAD in the right leg and moderate in the left leg confirmed by home screening. Untreated severe PAD risks ischemia and tissue necrosis. Further evaluation by a vascular specialist is necessary. - Refer to vascular team for further evaluation and management.  Diverticulosis of colon Colonoscopy indicates diverticulosis. Recent pain may suggest diverticulitis, but pain resolved and has not recurred in years. Advised to contact office if pain returns with fever, chills, or inability to keep food down. - due for colonoscopy. Referral placed.    Return in about 1 week (around 08/26/2024) for physical and fasting labs.SABRA Carrol Aurora, NP

## 2024-08-24 ENCOUNTER — Other Ambulatory Visit (INDEPENDENT_AMBULATORY_CARE_PROVIDER_SITE_OTHER): Payer: Self-pay | Admitting: Vascular Surgery

## 2024-08-24 DIAGNOSIS — I739 Peripheral vascular disease, unspecified: Secondary | ICD-10-CM

## 2024-08-25 ENCOUNTER — Ambulatory Visit: Payer: Self-pay | Admitting: General Practice

## 2024-08-25 ENCOUNTER — Encounter (INDEPENDENT_AMBULATORY_CARE_PROVIDER_SITE_OTHER): Payer: Self-pay | Admitting: Vascular Surgery

## 2024-08-25 ENCOUNTER — Ambulatory Visit (INDEPENDENT_AMBULATORY_CARE_PROVIDER_SITE_OTHER): Admitting: Vascular Surgery

## 2024-08-25 ENCOUNTER — Encounter: Payer: Self-pay | Admitting: General Practice

## 2024-08-25 ENCOUNTER — Ambulatory Visit (INDEPENDENT_AMBULATORY_CARE_PROVIDER_SITE_OTHER): Admitting: General Practice

## 2024-08-25 ENCOUNTER — Ambulatory Visit (INDEPENDENT_AMBULATORY_CARE_PROVIDER_SITE_OTHER)

## 2024-08-25 VITALS — BP 122/80 | HR 80 | Temp 97.3°F | Ht 65.9 in | Wt 119.0 lb

## 2024-08-25 VITALS — BP 122/79 | HR 61 | Resp 18 | Wt 117.6 lb

## 2024-08-25 DIAGNOSIS — R29898 Other symptoms and signs involving the musculoskeletal system: Secondary | ICD-10-CM | POA: Diagnosis not present

## 2024-08-25 DIAGNOSIS — Z780 Asymptomatic menopausal state: Secondary | ICD-10-CM

## 2024-08-25 DIAGNOSIS — I739 Peripheral vascular disease, unspecified: Secondary | ICD-10-CM

## 2024-08-25 DIAGNOSIS — Z0000 Encounter for general adult medical examination without abnormal findings: Secondary | ICD-10-CM

## 2024-08-25 LAB — COMPREHENSIVE METABOLIC PANEL WITH GFR
ALT: 17 U/L (ref 0–35)
AST: 20 U/L (ref 0–37)
Albumin: 4.5 g/dL (ref 3.5–5.2)
Alkaline Phosphatase: 46 U/L (ref 39–117)
BUN: 15 mg/dL (ref 6–23)
CO2: 30 meq/L (ref 19–32)
Calcium: 9.2 mg/dL (ref 8.4–10.5)
Chloride: 104 meq/L (ref 96–112)
Creatinine, Ser: 0.7 mg/dL (ref 0.40–1.20)
GFR: 84.52 mL/min (ref >60.00)
Glucose, Bld: 92 mg/dL (ref 70–99)
Potassium: 4.9 meq/L (ref 3.5–5.1)
Sodium: 140 meq/L (ref 135–145)
Total Bilirubin: 0.6 mg/dL (ref 0.2–1.2)
Total Protein: 6.6 g/dL (ref 6.0–8.3)

## 2024-08-25 LAB — CBC
HCT: 42.5 % (ref 36.0–46.0)
Hemoglobin: 14 g/dL (ref 12.0–15.0)
MCHC: 32.9 g/dL (ref 30.0–36.0)
MCV: 92.4 fl (ref 78.0–100.0)
Platelets: 140 K/uL — ABNORMAL LOW (ref 150.0–400.0)
RBC: 4.59 Mil/uL (ref 3.87–5.11)
RDW: 12.9 % (ref 11.5–15.5)
WBC: 4 K/uL (ref 4.0–10.5)

## 2024-08-25 LAB — TSH: TSH: 0.79 u[IU]/mL (ref 0.35–5.50)

## 2024-08-25 LAB — LIPID PANEL
Cholesterol: 223 mg/dL — ABNORMAL HIGH (ref 0–200)
HDL: 67.6 mg/dL (ref >39.00)
LDL Cholesterol: 140 mg/dL — ABNORMAL HIGH (ref 0–99)
NonHDL: 155.06
Total CHOL/HDL Ratio: 3
Triglycerides: 74 mg/dL (ref 0.0–149.0)
VLDL: 14.8 mg/dL (ref 0.0–40.0)

## 2024-08-25 NOTE — Assessment & Plan Note (Signed)
 Immunizations UTD. Declines shingrix.  Mammogram UTD Colonoscopy due  Discussed the importance of a healthy diet and regular exercise in order for weight loss, and to reduce the risk of further co-morbidity.  Exam stable. Labs pending.  Follow up in 1 year for repeat physical.

## 2024-08-25 NOTE — Progress Notes (Signed)
 Subjective:    Patient ID: Shelby Sullivan, female    DOB: [birth year], 73 y.o.   MRN: [id number] Chief Complaint  Patient presents with   New Patient (Initial Visit)    Ref Vincente consult PAD     Shelby Sullivan si a 73 yo female who presents to clinic today with chief complaint of bilateral lower extremity aching and weakness.  According to the patient's sometime last year she underwent some type of physical for which she could not recall which apparently showed some type of blockages to her lower extremities.  It had reported that she had 20% blockage in 1 leg and 30% blockage of the other leg.  She also presented to her PCP for annual wellness physical and mentioned the leg weakness with throbbing.  Patient endorses she spends a lot of time on her feet walking around and standing and therefore has attributed the symptoms to that.  She does not have any swelling and denies any direct pain or sharp shooting pains to her feet.  She denies any numbness or tingling.  She denies any falls.    Review of Systems  Constitutional: Negative.   Musculoskeletal:  Positive for myalgias.  All other systems reviewed and are negative.      Objective:   Physical Exam Vitals reviewed.  Constitutional:      Appearance: Normal appearance. She is normal weight.  HENT:     Head: Normocephalic.  Eyes:     Pupils: Pupils are equal, round, and reactive to light.  Cardiovascular:     Rate and Rhythm: Normal rate and regular rhythm.     Pulses: Normal pulses.     Heart sounds: Normal heart sounds.  Pulmonary:     Effort: Pulmonary effort is normal.     Breath sounds: Normal breath sounds.  Abdominal:     General: Abdomen is flat. Bowel sounds are normal.     Palpations: Abdomen is soft.  Musculoskeletal:        General: Normal Schneeberger of motion.  Skin:    General: Skin is warm and dry.     Capillary Refill: Capillary refill takes 2 to 3 seconds.  Neurological:     General: No focal deficit present.      Mental Status: She is alert and oriented to person, place, and time. Mental status is at baseline.  Psychiatric:        Mood and Affect: Mood normal.        Behavior: Behavior normal.        Thought Content: Thought content normal.        Judgment: Judgment normal.     BP 122/79   Pulse 61   Resp 18   Wt 117 lb 9.6 oz (53.3 kg)   BMI 19.04 kg/m   Past Medical History:  Diagnosis Date   Allergy    Benign tumor of breast 1996   GERD (gastroesophageal reflux disease)    Hyperlipidemia    ITP (idiopathic thrombocytopenic purpura) 1997    Social History   Socioeconomic History   Marital status: Married    Spouse name: Not on file   Number of children: 0   Years of education: Not on file   Highest education level: Master's degree (e.g., MA, MS, MEng, MEd, MSW, MBA)  Occupational History   Occupation: Child psychotherapist    Employer: [employer]    Comment: Retired  Tobacco Use   Smoking status: Never    Passive exposure: Past  Smokeless tobacco: Never  Substance and Sexual Activity   Alcohol use: Yes    Alcohol/week: 7.0 standard drinks of alcohol    Types: 7 Glasses of wine per week   Drug use: No   Sexual activity: Not on file  Other Topics Concern   Not on file  Social History Narrative   No living will   Would want husband as POA---alternative is god-daughter Lyle Fisher   Would accept resuscitation attempts   Would not want prolonged tube feeds if cognitively unaware   Social Drivers of Health   Financial Resource Strain: Low Risk  (08/15/2024)   Overall Financial Resource Strain (CARDIA)    Difficulty of Paying Living Expenses: Not hard at all  Food Insecurity: No Food Insecurity (08/15/2024)   Hunger Vital Sign    Worried About Running Out of Food in the Last Year: Never true    Ran Out of Food in the Last Year: Never true  Transportation Needs: No Transportation Needs (08/15/2024)   PRAPARE - Administrator, Civil Service (Medical): No     Lack of Transportation (Non-Medical): No  Physical Activity: Sufficiently Active (08/15/2024)   Exercise Vital Sign    Days of Exercise per Week: 6 days    Minutes of Exercise per Session: 60 min  Stress: Stress Concern Present (08/15/2024)   [employer] of Occupational Health - Occupational Stress Questionnaire    Feeling of Stress: To some extent  Social Connections: Socially Integrated (08/15/2024)   Social Connection and Isolation Panel    Frequency of Communication with Friends and Family: Twice a week    Frequency of Social Gatherings with Friends and Family: Three times a week    Attends Religious Services: More than 4 times per year    Active Member of Clubs or Organizations: Yes    Attends Banker Meetings: Not on file    Marital Status: Married  Catering manager Violence: Not on file    Past Surgical History:  Procedure Laterality Date   WRIST FRACTURE SURGERY  06/2010   right wrist    Family History  Problem Relation Age of Onset   Hypertension Father    Heart disease Paternal Grandfather    Diabetes Paternal Grandfather    Thyroid  nodules Mother    Hyperparathyroidism Sister    Cancer Neg Hx        breast cancer   Colon cancer Neg Hx    Esophageal cancer Neg Hx    Rectal cancer Neg Hx    Stomach cancer Neg Hx     Allergies  Allergen Reactions   Sulfa  Antibiotics Hives    Headaches, malacia, nausea   Fentanyl Other (See Comments)    After cataract surgery--CNS side effects   Zofran [Ondansetron] Other (See Comments)    After cataract surgery---CNS side effects   Silver  Sulfadiazine  Rash    Ears ringing, Heart palpations       Latest Ref Rng & Units 08/25/2024    8:40 AM 08/19/2023    9:54 AM 08/13/2022   10:07 AM  CBC  WBC 4.0 - 10.5 K/uL 4.0  4.2  4.2   Hemoglobin 12.0 - 15.0 g/dL 85.9  86.5  86.2   Hematocrit 36.0 - 46.0 % 42.5  41.2  40.6   Platelets 150.0 - 400.0 K/uL 140.0  131.0  119.0       CMP     Component Value  Date/Time   NA 140 08/25/2024 0840   K  4.9 08/25/2024 0840   CL 104 08/25/2024 0840   CO2 30 08/25/2024 0840   GLUCOSE 92 08/25/2024 0840   BUN 15 08/25/2024 0840   CREATININE 0.70 08/25/2024 0840   CALCIUM 9.2 08/25/2024 0840   PROT 6.6 08/25/2024 0840   ALBUMIN 4.5 08/25/2024 0840   AST 20 08/25/2024 0840   ALT 17 08/25/2024 0840   ALKPHOS 46 08/25/2024 0840   BILITOT 0.6 08/25/2024 0840   GFR 84.52 08/25/2024 0840     No results found.     Assessment & Plan:   1. Leg weakness, bilateral (Primary) Patient underwent bilateral lower extremity arterial duplex ultrasounds with ABIs today.  This was done as a follow-up to legs aching with weakness on an annual physical by her PCP.  Ultrasounds today were all normal.  She has triphasic waveforms throughout both lower extremities.  ABIs were within normal [place].  Patient does not have any vascular difficulties.  Aching to her lower extremities may be the start of spinal stenosis and this may be more neurogenic and vascular at this time.  No other physical symptoms to note today.  Patient will follow-up with vein and vascular surgery as needed.   No current outpatient medications on file prior to visit.   No current facility-administered medications on file prior to visit.    There are no Patient Instructions on file for this visit. No follow-ups on file.   Gwendlyn JONELLE Shank, NP

## 2024-08-25 NOTE — Patient Instructions (Addendum)
 Stop by the lab prior to leaving today. I will notify you of your results once received.   Schedule medicare wellness visit with nurse.   Follow up in one year for physical.   It was a pleasure to see you today!

## 2024-08-25 NOTE — Progress Notes (Signed)
 Established Patient Office Visit  Subjective   Patient ID: Shelby Sullivan, female    DOB: 1951-11-06  Age: 73 y.o. MRN: [id number]  Chief Complaint  Patient presents with   Annual Exam    HPI  Shelby Sullivan is  73 year old female with past medical history of GERD, HLD presents today for complete physical and follow up of chronic conditions.  Immunizations: -Tetanus: Completed in 2020 -Influenza: completed this season. -Shingles: due; declines -Pneumonia: Completed   Diet: Fair diet.  Exercise:  regular exercise.  Eye exam: Completes annually  Dental exam: Completes semi-annually    Mammogram: Completed in August, 2025 Bone Density Scan: Completed in [address]; due  Colonoscopy: Completed in 2013; due; referral placed.  Patient Active Problem List   Diagnosis Date Noted   Encounter for screening and preventative care 08/25/2024   Thrombocytopenia 01/16/2017   Advance directive discussed with patient 01/15/2017   Routine general medical examination at a health care facility 08/13/2012   Hyperlipemia 06/08/2007   Allergic rhinitis due to pollen 06/08/2007   GERD 06/08/2007   Past Medical History:  Diagnosis Date   Allergy    Benign tumor of breast 1996   GERD (gastroesophageal reflux disease)    Hyperlipidemia    ITP (idiopathic thrombocytopenic purpura) 1997   Past Surgical History:  Procedure Laterality Date   WRIST FRACTURE SURGERY  06/2010   right wrist   Allergies  Allergen Reactions   Sulfa  Antibiotics Hives    Headaches, malacia, nausea   Fentanyl Other (See Comments)    After cataract surgery--CNS side effects   Zofran [Ondansetron] Other (See Comments)    After cataract surgery---CNS side effects   Silver  Sulfadiazine  Rash    Ears ringing, Heart palpations         08/19/2024    9:29 AM 08/19/2023    9:23 AM 08/19/2023    9:01 AM  Depression screen PHQ 2/9  Decreased Interest 0 0 0  Down, Depressed, Hopeless 1 0 0  PHQ - 2 Score 1 0 0   Altered sleeping 0    Tired, decreased energy 1    Change in appetite 0    Feeling bad or failure about yourself  0    Trouble concentrating 0    Moving slowly or fidgety/restless 0    Suicidal thoughts 0    PHQ-9 Score 2    Difficult doing work/chores Not difficult at all         08/19/2024    9:29 AM  GAD 7 : Generalized Anxiety Score  Nervous, Anxious, on Edge 1  Control/stop worrying 0  Worry too much - different things 1  Trouble relaxing 0  Restless 0  Easily annoyed or irritable 0  Afraid - awful might happen 1  Total GAD 7 Score 3  Anxiety Difficulty Not difficult at all      Review of Systems  Constitutional:  Negative for chills, fever, malaise/fatigue and weight loss.  HENT:  Negative for congestion, ear discharge, ear pain, hearing loss, nosebleeds, sinus pain, sore throat and tinnitus.   Eyes:  Negative for blurred vision, double vision, pain, discharge and redness.  Respiratory:  Negative for cough, shortness of breath, wheezing and stridor.   Cardiovascular:  Negative for chest pain, palpitations and leg swelling.  Gastrointestinal:  Negative for abdominal pain, constipation, diarrhea, heartburn, nausea and vomiting.  Genitourinary:  Negative for dysuria, frequency and urgency.  Musculoskeletal:  Negative for myalgias.  Skin:  Negative for  rash.  Neurological:  Negative for dizziness, tingling, seizures, weakness and headaches.  Psychiatric/Behavioral:  Negative for depression, substance abuse and suicidal ideas. The patient is not nervous/anxious.       Objective:     BP 122/80   Pulse 80   Temp (!) 97.3 F (36.3 C) (Oral)   Ht 5' 5.9 (1.674 m)   Wt 119 lb (54 kg)   SpO2 98%   BMI 19.27 kg/m  BP Readings from Last 3 Encounters:  08/25/24 122/80  08/19/24 128/80  08/19/23 124/84   Wt Readings from Last 3 Encounters:  08/25/24 119 lb (54 kg)  08/19/24 118 lb (53.5 kg)  08/19/23 115 lb (52.2 kg)      Physical Exam Vitals and nursing  note reviewed.  Constitutional:      Appearance: Normal appearance.  HENT:     Head: Normocephalic and atraumatic.     Right Ear: Tympanic membrane, ear canal and external ear normal.     Left Ear: Tympanic membrane, ear canal and external ear normal.     Nose: Nose normal.     Mouth/Throat:     Mouth: Mucous membranes are moist.     Pharynx: Oropharynx is clear.  Eyes:     Conjunctiva/sclera: Conjunctivae normal.     Pupils: Pupils are equal, round, and reactive to light.  Cardiovascular:     Rate and Rhythm: Normal rate and regular rhythm.     Pulses: Normal pulses.     Heart sounds: Normal heart sounds.  Pulmonary:     Effort: Pulmonary effort is normal.     Breath sounds: Normal breath sounds.  Abdominal:     General: Abdomen is flat. Bowel sounds are normal.     Palpations: Abdomen is soft.  Musculoskeletal:        General: Normal Mccutcheon of motion.     Cervical back: Normal Underwood of motion.  Skin:    General: Skin is warm and dry.     Capillary Refill: Capillary refill takes less than 2 seconds.  Neurological:     General: No focal deficit present.     Mental Status: She is alert and oriented to person, place, and time. Mental status is at baseline.  Psychiatric:        Mood and Affect: Mood normal.        Behavior: Behavior normal.        Thought Content: Thought content normal.        Judgment: Judgment normal.      No results found for any visits on 08/25/24.     The 10-year ASCVD risk score (Arnett DK, et al., 2019) is: 14.9%    Assessment & Plan:  Encounter for screening and preventative care Assessment & Plan: Immunizations UTD. Declines shingrix.  Mammogram UTD Colonoscopy due  Discussed the importance of a healthy diet and regular exercise in order for weight loss, and to reduce the risk of further co-morbidity.  Exam stable. Labs pending.  Follow up in 1 year for repeat physical.   Orders: -     CBC -     Comprehensive metabolic panel with  GFR -     Lipid panel -     TSH  Postmenopausal -     DG Bone Density; Future     Return in about 1 year (around 08/25/2025) for physical and fasting labs.SABRA Carrol Aurora, NP

## 2024-09-19 ENCOUNTER — Telehealth: Payer: Self-pay

## 2024-09-19 ENCOUNTER — Other Ambulatory Visit: Payer: Self-pay

## 2024-09-19 DIAGNOSIS — Z1211 Encounter for screening for malignant neoplasm of colon: Secondary | ICD-10-CM

## 2024-09-19 MED ORDER — NA SULFATE-K SULFATE-MG SULF 17.5-3.13-1.6 GM/177ML PO SOLN: 1.0000 | Freq: Once | ORAL | 0 refills | Status: AC

## 2024-09-19 NOTE — Telephone Encounter (Signed)
 Per pt received letter to schedule procedure . Pt is now ready to schedule

## 2024-09-19 NOTE — Telephone Encounter (Signed)
 Gastroenterology Pre-Procedure Review  Request Date: 10/25/25 Requesting Physician: Dr. Jinny  PATIENT REVIEW QUESTIONS: The patient responded to the following health history questions as indicated:    1. Are you having any GI issues? no 2. Do you have a personal history of Polyps? no 3. Do you have a family history of Colon Cancer or Polyps? no 4. Diabetes Mellitus? no 5. Joint replacements in the past 12 months?no 6. Major health problems in the past 3 months?no 7. Any artificial heart valves, MVP, or defibrillator?no    MEDICATIONS & ALLERGIES:    Patient reports the following regarding taking any anticoagulation/antiplatelet therapy:   Plavix, Coumadin, Eliquis, Xarelto, Lovenox, Pradaxa, Brilinta, or Effient? no Aspirin? no  Patient confirms/reports the following medications:  No current outpatient medications on file.   No current facility-administered medications for this visit.    Patient confirms/reports the following allergies:  Allergies  Allergen Reactions   Sulfa  Antibiotics Hives    Headaches, malacia, nausea   Fentanyl Other (See Comments)    After cataract surgery--CNS side effects   Zofran [Ondansetron] Other (See Comments)    After cataract surgery---CNS side effects   Silver  Sulfadiazine  Rash    Ears ringing, Heart palpations    No orders of the defined types were placed in this encounter.   AUTHORIZATION INFORMATION Primary Insurance: 1D#: Group #:  Secondary Insurance: 1D#: Group #:  SCHEDULE INFORMATION: Date: 10/25/25 Time: Location: [facility]

## 2024-10-18 ENCOUNTER — Encounter: Payer: Self-pay | Admitting: Gastroenterology

## 2024-10-24 ENCOUNTER — Ambulatory Visit

## 2024-10-24 VITALS — BP 122/79 | Ht 65.0 in | Wt 119.0 lb

## 2024-10-24 DIAGNOSIS — Z0000 Encounter for general adult medical examination without abnormal findings: Secondary | ICD-10-CM

## 2024-10-24 NOTE — Patient Instructions (Signed)
 Ms. Bumpus,  Thank you for taking the time for your Medicare Wellness Visit. I appreciate your continued commitment to your health goals. Please review the care plan we discussed, and feel free to reach out if I can assist you further.  Please note that Annual Wellness Visits do not include a physical exam. Some assessments may be limited, especially if the visit was conducted virtually. If needed, we may recommend an in-person follow-up with your provider.  Ongoing Care Seeing your primary care provider every 3 to 6 months helps us  monitor your health and provide consistent, personalized care.   Referrals If a referral was made during today's visit and you haven't received any updates within two weeks, please contact the referred provider directly to check on the status.  Recommended Screenings:  Health Maintenance  Topic Date Due   Zoster (Shingles) Vaccine (1 of 2) Never done   Colon Cancer Screening  10/12/2022   Medicare Annual Wellness Visit  08/18/2024   COVID-19 Vaccine (6 - 2025-26 season) 09/03/2026*   DTaP/Tdap/Td vaccine (4 - Tdap) 12/06/2028   Pneumococcal Vaccine for age over 53  Completed   Flu Shot  Completed   Osteoporosis screening with Bone Density Scan  Completed   Hepatitis C Screening  Completed   Meningitis B Vaccine  Aged Out   Breast Cancer Screening  Discontinued  *Topic was postponed. The date shown is not the original due date.       08/02/2021    1:29 PM  Advanced Directives  Does Patient Have a Medical Advance Directive? No  Would patient like information on creating a medical advance directive? No - Patient declined    Vision: Annual vision screenings are recommended for early detection of glaucoma, cataracts, and diabetic retinopathy. These exams can also reveal signs of chronic conditions such as diabetes and high blood pressure.  Dental: Annual dental screenings help detect early signs of oral cancer, gum disease, and other conditions linked to  overall health, including heart disease and diabetes.  Please see the attached documents for additional preventive care recommendations.

## 2024-10-24 NOTE — H&P (Signed)
 10/25/24 HPI: Pt is a 73 yo F presenting for screening colonoscopy. Chart reviewed. Last colonoscopy: 2003 and 2013. Fhx CRC: None. Blood thinners: None.   PE: HEENT: EOMI CV: RRR, no m/r/g Neck: Trachea midline Lungs: CTAB Abd: Soft, NT, NABS Ext: No edema.   Assessment: The procedure's risks and benefits were described to the patient and they wish to proceed.     Clotilda Schaffer, MD [facility]

## 2024-10-24 NOTE — Progress Notes (Signed)
 I connected with  Shelby Sullivan on 10/24/24 by a audio enabled telemedicine application and verified that I am speaking with the correct person using two identifiers.  Patient Location: Home  Provider Location: Home Office  Persons Participating in Visit: Patient.  I discussed the limitations of evaluation and management by telemedicine. The patient expressed understanding and agreed to proceed.  Vital Signs: Because this visit was a virtual/telehealth visit, some criteria may be missing or patient reported. Any vitals not documented were not able to be obtained and vitals that have been documented are patient reported.   This visit was performed by a medical professional under my direct supervision. I was immediately available for consultation/collaboration. I have reviewed and agree with the Annual Wellness Visit documentation.  Chief Complaint  Patient presents with   Medicare Wellness     Subjective:   Shelby Sullivan is a 73 y.o. female who presents for a Medicare Annual Wellness Visit.  Visit info / Clinical Intake: Medicare Wellness Visit Type:: Subsequent Annual Wellness Visit Persons participating in visit and providing information:: patient Medicare Wellness Visit Mode:: Telephone If telephone:: video declined Since this visit was completed virtually, some vitals may be partially provided or unavailable. Missing vitals are due to the limitations of the virtual format.: Documented vitals are patient reported If Telephone or Video please confirm:: I connected with patient using audio/video enable telemedicine. I verified patient identity with two identifiers, discussed telehealth limitations, and patient agreed to proceed. Patient Location:: home Provider Location:: home office Interpreter Needed?: No Pre-visit prep was completed: yes AWV questionnaire completed by patient prior to visit?: yes Date:: 10/21/24 Living arrangements:: (Patient-Rptd) lives with spouse/significant  other Patient's Overall Health Status Rating: (Patient-Rptd) very good Typical amount of pain: (Patient-Rptd) some Does pain affect daily life?: (Patient-Rptd) no Are you currently prescribed opioids?: no  Dietary Habits and Nutritional Risks How many meals a day?: (Patient-Rptd) 3 Eats fruit and vegetables daily?: (Patient-Rptd) yes Most meals are obtained by: (Patient-Rptd) preparing own meals In the last 2 weeks, have you had any of the following?: none Diabetic:: no  Functional Status Activities of Daily Living (to include ambulation/medication): (Patient-Rptd) Independent Ambulation: (Patient-Rptd) Independent Medication Administration: (Patient-Rptd) Independent Home Management (perform basic housework or laundry): (Patient-Rptd) Independent Manage your own finances?: (Patient-Rptd) yes Primary transportation is: (Patient-Rptd) driving Concerns about vision?: (!) yes (wears readers) Concerns about hearing?: no  Fall Screening Falls in the past year?: (Patient-Rptd) 0 Number of falls in past year: 0 Was there an injury with Fall?: 0 Fall Risk Category Calculator: 0 Patient Fall Risk Level: Low Fall Risk  Fall Risk Patient at Risk for Falls Due to: No Fall Risks Fall risk Follow up: Falls evaluation completed  Home and Transportation Safety: All rugs have non-skid backing?: (Patient-Rptd) yes All stairs or steps have railings?: (Patient-Rptd) yes Grab bars in the bathtub or shower?: (!) (Patient-Rptd) no Have non-skid surface in bathtub or shower?: (Patient-Rptd) yes Good home lighting?: (Patient-Rptd) yes Regular seat belt use?: (Patient-Rptd) yes Hospital stays in the last year:: (Patient-Rptd) no  Cognitive Assessment Difficulty concentrating, remembering, or making decisions? : (Patient-Rptd) no Will 6CIT or Mini Cog be Completed: yes What year is it?: 0 points What month is it?: 0 points Give patient an address phrase to remember (5 components): [address]  [place] About what time is it?: 0 points Count backwards from 20 to 1: 0 points Say the months of the year in reverse: 0 points Repeat the address phrase from  earlier: 0 points 6 CIT Score: 0 points  Advance Directives (For Healthcare) Does Patient Have a Medical Advance Directive?: Yes Does patient want to make changes to medical advance directive?: No - Patient declined Type of Advance Directive: Healthcare Power of [place]; Living will; Out of facility DNR (pink MOST or yellow form) Copy of Healthcare Power of Attorney in Chart?: No - copy requested Copy of Living Will in Chart?: No - copy requested Out of facility DNR (pink MOST or yellow form) in Chart? (Ambulatory ONLY): No - copy requested  Reviewed/Updated  Reviewed/Updated: Reviewed All (Medical, Surgical, Family, Medications, Allergies, Care Teams, Patient Goals)    Allergies (verified) Sulfa  antibiotics, Fentanyl, Zofran [ondansetron], and Silver  sulfadiazine    Current Medications (verified) Outpatient Encounter Medications as of 10/24/2024  Medication Sig   Na Sulfate-K Sulfate-Mg Sulfate concentrate (SUPREP) 17.5-3.13-1.6 GM/177ML SOLN Take 1 kit by mouth once.   No facility-administered encounter medications on file as of 10/24/2024.    History: Past Medical History:  Diagnosis Date   Allergy    Benign tumor of breast 1996   GERD (gastroesophageal reflux disease)    Hyperlipidemia    ITP (idiopathic thrombocytopenic purpura) 1997   Past Surgical History:  Procedure Laterality Date   cataracts Bilateral 2022   WRIST FRACTURE SURGERY  06/17/2010   right wrist   Family History  Problem Relation Age of Onset   Hypertension Father    Heart disease Paternal Grandfather    Diabetes Paternal Grandfather    Thyroid  nodules Mother    Hyperparathyroidism Sister    Cancer Neg Hx        breast cancer   Colon cancer Neg Hx    Esophageal cancer Neg Hx    Rectal cancer Neg Hx    Stomach cancer Neg Hx     Social History   Occupational History   Occupation: Hospital Doctor: [employer]    Comment: Retired  Tobacco Use   Smoking status: Never    Passive exposure: Past   Smokeless tobacco: Never  Substance and Sexual Activity   Alcohol use: Yes    Alcohol/week: 7.0 standard drinks of alcohol    Types: 7 Glasses of wine per week   Drug use: No   Sexual activity: Not on file   Tobacco Counseling Counseling given: Not Answered  SDOH Screenings   Food Insecurity: No Food Insecurity (10/21/2024)  Housing: Low Risk  (10/21/2024)  Transportation Needs: No Transportation Needs (10/21/2024)  Utilities: Not At Risk (10/24/2024)  Alcohol Screen: Low Risk  (10/21/2024)  Depression (PHQ2-9): Low Risk  (10/24/2024)  Financial Resource Strain: Low Risk  (10/21/2024)  Physical Activity: Sufficiently Active (10/21/2024)  Social Connections: Socially Integrated (10/21/2024)  Stress: No Stress Concern Present (10/21/2024)  Recent Concern: Stress - Stress Concern Present (08/15/2024)  Tobacco Use: Low Risk  (10/24/2024)  Health Literacy: Adequate Health Literacy (10/24/2024)   See flowsheets for full screening details  Depression Screen PHQ 2 & 9 Depression Scale- Over the past 2 weeks, how often have you been bothered by any of the following problems? Little interest or pleasure in doing things: 0 Feeling down, depressed, or hopeless (PHQ Adolescent also includes...irritable): 0 PHQ-2 Total Score: 0 Trouble falling or staying asleep, or sleeping too much: 0 Feeling tired or having little energy: 0 Poor appetite or overeating (PHQ Adolescent also includes...weight loss): 0 Feeling bad about yourself - or that you are a failure or have let yourself or your family down: 0 Trouble concentrating  on things, such as reading the newspaper or watching television [facility] Adolescent also includes...like school work): 0 Moving or speaking so slowly that other people could have noticed. Or the opposite -  being so fidgety or restless that you have been moving around a lot more than usual: 0 Thoughts that you would be better off dead, or of hurting yourself in some way: 0 PHQ-9 Total Score: 0 If you checked off any problems, how difficult have these problems made it for you to do your work, take care of things at home, or get along with other people?: Not difficult at all  Depression Treatment Depression Interventions/Treatment : EYV7-0 Score <4 Follow-up Not Indicated     Goals Addressed             This Visit's Progress    Patient Stated       Patient states she would like to take more classes at the [facility]             Objective:    Today's Vitals   10/24/24 1037  BP: 122/79  Weight: 119 lb (54 kg)  Height: 5' 5 (1.651 m)   Body mass index is 19.8 kg/m.  Hearing/Vision screen Hearing Screening - Comments:: No difficulties  Vision Screening - Comments:: Patient wears readers  Immunizations and Health Maintenance Health Maintenance  Topic Date Due   Zoster Vaccines- Shingrix (1 of 2) Never done   Colonoscopy  10/12/2022   Medicare Annual Wellness (AWV)  08/18/2024   COVID-19 Vaccine (6 - 2025-26 season) 09/03/2026 (Originally 07/18/2024)   DTaP/Tdap/Td (4 - Tdap) 12/06/2028   Pneumococcal Vaccine: 50+ Years  Completed   Influenza Vaccine  Completed   Bone Density Scan  Completed   Hepatitis C Screening  Completed   Meningococcal B Vaccine  Aged Out   Mammogram  Discontinued        Assessment/Plan:  This is a routine wellness examination for Shelby Sullivan.  Patient Care Team: Vincente Shivers, NP as PCP - General (General Practice)  I have personally reviewed and noted the following in the patient's chart:   Medical and social history Use of alcohol, tobacco or illicit drugs  Current medications and supplements including opioid prescriptions. Functional ability and status Nutritional status Physical activity Advanced directives List of other  physicians Hospitalizations, surgeries, and ER visits in previous 12 months Vitals Screenings to include cognitive, depression, and falls Referrals and appointments  No orders of the defined types were placed in this encounter.  In addition, I have reviewed and discussed with patient certain preventive protocols, quality metrics, and best practice recommendations. A written personalized care plan for preventive services as well as general preventive health recommendations were provided to patient.   Lyle MARLA Right, [place]   10/24/2024   No follow-ups on file.  After Visit Summary: (MyChart) Due to this being a telephonic visit, the after visit summary with patients personalized plan was offered to patient via MyChart   Nurse Notes: nothing to report

## 2024-10-25 ENCOUNTER — Ambulatory Visit

## 2024-10-25 ENCOUNTER — Ambulatory Visit
Admission: RE | Admit: 2024-10-25 | Discharge: 2024-10-25 | Disposition: A | Attending: Gastroenterology | Admitting: Gastroenterology

## 2024-10-25 ENCOUNTER — Encounter: Payer: Self-pay | Admitting: Gastroenterology

## 2024-10-25 ENCOUNTER — Other Ambulatory Visit: Payer: Self-pay

## 2024-10-25 ENCOUNTER — Encounter: Admission: RE | Disposition: A | Payer: Self-pay | Source: Home / Self Care | Attending: Gastroenterology

## 2024-10-25 DIAGNOSIS — Z1211 Encounter for screening for malignant neoplasm of colon: Secondary | ICD-10-CM

## 2024-10-25 HISTORY — PX: POLYPECTOMY: SHX149

## 2024-10-25 HISTORY — PX: COLONOSCOPY: SHX5424

## 2024-10-25 SURGERY — COLONOSCOPY
Anesthesia: General

## 2024-10-25 MED ORDER — PROPOFOL 10 MG/ML IV BOLUS
INTRAVENOUS | Status: DC | PRN
  Administered 2024-10-25: 10 mg via INTRAVENOUS
  Administered 2024-10-25: 20 mg via INTRAVENOUS
  Administered 2024-10-25: 40 mg via INTRAVENOUS
  Administered 2024-10-25: 30 mg via INTRAVENOUS

## 2024-10-25 MED ORDER — SODIUM CHLORIDE 0.9 % IV SOLN: INTRAVENOUS | Status: DC

## 2024-10-25 MED ORDER — LIDOCAINE HCL (CARDIAC) PF 100 MG/5ML IV SOSY
PREFILLED_SYRINGE | INTRAVENOUS | Status: DC | PRN
  Administered 2024-10-25: 40 mg via INTRAVENOUS

## 2024-10-25 MED ORDER — PROPOFOL 10 MG/ML IV BOLUS
INTRAVENOUS | Status: AC
  Filled 2024-10-25: qty 20

## 2024-10-25 MED ORDER — PROPOFOL 500 MG/50ML IV EMUL
INTRAVENOUS | Status: DC | PRN
  Administered 2024-10-25: 100 ug/kg/min via INTRAVENOUS

## 2024-10-25 NOTE — Anesthesia Postprocedure Evaluation (Signed)
 Anesthesia Post Note  Patient: Shelby Sullivan  Procedure(s) Performed: COLONOSCOPY POLYPECTOMY, INTESTINE  Patient location during evaluation: PACU Anesthesia Type: General Level of consciousness: awake Pain management: satisfactory to patient Vital Signs Assessment: post-procedure vital signs reviewed and stable Respiratory status: spontaneous breathing Cardiovascular status: stable Anesthetic complications: no   No notable events documented.   Last Vitals:  Vitals:   10/25/24 0914 10/25/24 0925  BP: 112/86 (!) 144/94  Pulse: 73 92  Resp: (!) 24 14  Temp:    SpO2: 100% 100%    Last Pain:  Vitals:   10/25/24 0914  TempSrc:   PainSc: 0-No pain                 VAN STAVEREN,Trinton Prewitt

## 2024-10-25 NOTE — Anesthesia Preprocedure Evaluation (Signed)
 Anesthesia Evaluation  Patient identified by MRN, date of birth, ID band Patient awake    Reviewed: Allergy & Precautions, NPO status , Patient's Chart, lab work & pertinent test results  Airway Mallampati: II  TM Distance: >3 FB Neck ROM: Full    Dental  (+) Teeth Intact   Pulmonary neg pulmonary ROS   Pulmonary exam normal breath sounds clear to auscultation       Cardiovascular Exercise Tolerance: Good negative cardio ROS Normal cardiovascular exam Rhythm:Regular Rate:Normal     Neuro/Psych negative neurological ROS  negative psych ROS   GI/Hepatic negative GI ROS, Neg liver ROS,GERD  Medicated,,  Endo/Other  negative endocrine ROS    Renal/GU negative Renal ROS  negative genitourinary   Musculoskeletal negative musculoskeletal ROS (+)    Abdominal Normal abdominal exam  (+)   Peds negative pediatric ROS (+)  Hematology negative hematology ROS (+)   Anesthesia Other Findings Past Medical History: No date: Allergy 1996: Benign tumor of breast No date: GERD (gastroesophageal reflux disease) No date: Hyperlipidemia 1997: ITP (idiopathic thrombocytopenic purpura)  Past Surgical History: 2022: cataracts; Bilateral 06/17/2010: WRIST FRACTURE SURGERY     Comment:  right wrist  BMI    Body Mass Index: 19.14 kg/m      Reproductive/Obstetrics negative OB ROS                              Anesthesia Physical Anesthesia Plan  ASA: 2  Anesthesia Plan: General   Post-op Pain Management:    Induction: Intravenous  PONV Risk Score and Plan: Propofol  infusion and TIVA  Airway Management Planned: Natural Airway and Nasal Cannula  Additional Equipment:   Intra-op Plan:   Post-operative Plan:   Informed Consent: I have reviewed the patients History and Physical, chart, labs and discussed the procedure including the risks, benefits and alternatives for the proposed anesthesia  with the patient or authorized representative who has indicated his/her understanding and acceptance.     Dental Advisory Given  Plan Discussed with: CRNA  Anesthesia Plan Comments:         Anesthesia Quick Evaluation

## 2024-10-25 NOTE — Transfer of Care (Signed)
 Immediate Anesthesia Transfer of Care Note  Patient: Shelby Sullivan  Procedure(s) Performed: COLONOSCOPY POLYPECTOMY, INTESTINE  Patient Location: Endoscopy Unit  Anesthesia Type:General  Level of Consciousness: drowsy  Airway & Oxygen Therapy: Patient Spontanous Breathing  Post-op Assessment: Report given to RN  Post vital signs: stable  Last Vitals:  Vitals Value Taken Time  BP 97/42 10/25/24 09:03  Temp    Pulse 65 10/25/24 09:03  Resp 22 10/25/24 09:03  SpO2 99 % 10/25/24 09:03  Vitals shown include unfiled device data.  Last Pain:  Vitals:   10/25/24 0741  TempSrc: Temporal  PainSc: 0-No pain         Complications: No notable events documented.

## 2024-10-25 NOTE — Op Note (Signed)
 [facility] Patient Name: Shelby Sullivan Procedure Date: 10/25/2024 8:23 AM MRN: [id number] Account #: [id number] Date of Birth: Dec 11, 1950 Admit Type: Outpatient Age: 73 Room: [facility] ENDO ROOM 3 Gender: Female Note Status: Finalized Instrument Name: Peds Colonoscope [id number] Procedure:             Colonoscopy Providers:             Clotilda Schaffer, MD Referring MD:          Clotilda Schaffer, MD (Referring MD), Carrol Aurora                         (Referring MD) Complications:         No immediate complications. Estimated blood loss: None. Procedure:             Pre-Anesthesia Assessment:                        - Prior to the procedure, a History and Physical was                         performed, and patient medications and allergies were                         reviewed. The patient's tolerance of previous                         anesthesia was also reviewed. The risks and benefits                         of the procedure and the sedation options and risks                         were discussed with the patient. All questions were                         answered, and informed consent was obtained. Prior                         Anticoagulants: The patient has taken no anticoagulant                         or antiplatelet agents. ASA Grade Assessment: II - A                         patient with mild systemic disease. After reviewing                         the risks and benefits, the patient was deemed in                         satisfactory condition to undergo the procedure.                        After obtaining informed consent, the colonoscope was                         passed under direct vision. Throughout the procedure,  the patient's blood pressure, pulse, and oxygen                         saturations were monitored continuously. The                         Colonoscope was introduced through the anus and                          advanced to the the cecum, identified by appendiceal                         orifice and ileocecal valve. The quality of the bowel                         preparation was good. Findings:      An 8 mm polyp was found in the ascending colon. The polyp was sessile.       The polyp was removed with a cold snare. Resection and retrieval were       complete.      A few small-mouthed diverticula were found in the sigmoid colon.      Non-bleeding internal hemorrhoids were found during retroflexion. The       hemorrhoids were small. Impression:            - One 8 mm polyp in the ascending colon, removed with                         a cold snare. Resected and retrieved.                        - Diverticulosis in the sigmoid colon.                        - Non-bleeding internal hemorrhoids. Recommendation:        - Repeat colonoscopy in 5-10 years for surveillance                         based on pathology results.                        - High fiber diet. Procedure Code(s):     --- Professional ---                        [phone], Colonoscopy, flexible; with removal of                         tumor(s), polyp(s), or other lesion(s) by snare                         technique Diagnosis Code(s):     --- Professional ---                        D12.2, Benign neoplasm of ascending colon CPT copyright 2022 American Medical Association. All rights reserved. The codes documented in this report are preliminary and upon coder review may  be revised to meet current compliance requirements. Clotilda Schaffer, MD 10/25/2024 9:10:39 AM Number of Addenda: 0 Note Initiated On: 10/25/2024 8:23 AM  Total Procedure Duration: 0 hours 13 minutes 31 seconds  Estimated Blood Loss:  Estimated blood loss: none.      [facility]

## 2024-10-27 ENCOUNTER — Ambulatory Visit: Payer: Self-pay | Admitting: Gastroenterology

## 2024-10-27 LAB — SURGICAL PATHOLOGY

## 2025-08-31 ENCOUNTER — Encounter: Admitting: General Practice
